# Patient Record
Sex: Female | Born: 2000 | Race: Black or African American | Hispanic: No | Marital: Married | Smoking: Never smoker
Health system: Southern US, Community
[De-identification: ages and names within clinical notes are randomized; demographics above are authoritative.]

## PROBLEM LIST (undated history)

## (undated) ENCOUNTER — Emergency Department (HOSPITAL_COMMUNITY): Admission: EM | Payer: Medicaid Other | Source: Home / Self Care

## (undated) DIAGNOSIS — F419 Anxiety disorder, unspecified: Secondary | ICD-10-CM

## (undated) DIAGNOSIS — F329 Major depressive disorder, single episode, unspecified: Secondary | ICD-10-CM

## (undated) DIAGNOSIS — E282 Polycystic ovarian syndrome: Secondary | ICD-10-CM

## (undated) DIAGNOSIS — G479 Sleep disorder, unspecified: Secondary | ICD-10-CM

## (undated) DIAGNOSIS — F445 Conversion disorder with seizures or convulsions: Secondary | ICD-10-CM

## (undated) DIAGNOSIS — F429 Obsessive-compulsive disorder, unspecified: Secondary | ICD-10-CM

## (undated) DIAGNOSIS — F32A Depression, unspecified: Secondary | ICD-10-CM

## (undated) DIAGNOSIS — F4310 Post-traumatic stress disorder, unspecified: Secondary | ICD-10-CM

## (undated) HISTORY — DX: Sleep disorder, unspecified: G47.9

## (undated) HISTORY — PX: DENTAL SURGERY: SHX609

## (undated) HISTORY — PX: TONSILLECTOMY: SUR1361

## (undated) HISTORY — DX: Obsessive-compulsive disorder, unspecified: F42.9

## (undated) HISTORY — DX: Morbid (severe) obesity due to excess calories: E66.01

## (undated) NOTE — *Deleted (*Deleted)
[facility] EMERGENCY DEPARTMENT Provider Note   CSN: [id number] Arrival date & time: 01/24/20  1629     History Chief Complaint  Patient presents with  . Neck Pain    Elizabeth Lam is a 88 y.o. female   No trigger  8am    HPI     Past Medical History:  Diagnosis Date  . Anxiety   . Depression   . Difficulty sleeping   . OCD (obsessive compulsive disorder)   . PCOS (polycystic ovarian syndrome)   . Psychogenic nonepileptic seizure   . PTSD (post-traumatic stress disorder)     Patient Active Problem List   Diagnosis Date Noted  . Insomnia   . Vasovagal episode 10/30/2018  . Seizure-like activity (HCC) 10/30/2018  . Anxiety state 10/30/2018  . MDD (major depressive disorder), recurrent severe, without psychosis (HCC) 06/02/2018  . Chronic post-traumatic stress disorder (PTSD) 06/02/2018  . Suicide ideation 06/02/2018  . Self-injurious behavior 06/02/2018    Past Surgical History:  Procedure Laterality Date  . DENTAL SURGERY       OB History   No obstetric history on file.     Family History  Problem Relation Age of Onset  . Healthy Mother   . Healthy Father     Social History   Tobacco Use  . Smoking status: Never Smoker  . Smokeless tobacco: Never Used  Vaping Use  . Vaping Use: Never used  Substance Use Topics  . Alcohol use: Never  . Drug use: Yes    Types: Marijuana    Comment: marijuana    Home Medications Prior to Admission medications   Medication Sig Start Date End Date Taking? Authorizing Provider  cetirizine (ZYRTEC) 10 MG tablet Take 1 tablet (10 mg total) by mouth daily. 12/08/19   Hall-Potvin, [place], PA-C  EPINEPHrine 0.3 mg/0.3 mL IJ SOAJ injection Inject 0.3 mg into the muscle as needed for anaphylaxis.  02/12/19   [provider]  EUCRISA 2 % OINT Apply topically 2 (two) times daily. 12/08/19   [provider]  lidocaine (XYLOCAINE) 2 % solution Use as directed 15 mLs in the mouth or throat  as needed for mouth pain. 12/08/19   Hall-Potvin, [place], PA-C  methocarbamol (ROBAXIN) 500 MG tablet Take 1 tablet (500 mg total) by mouth 3 (three) times daily. 12/25/19   Wieters, Hallie C, PA-C  naproxen (NAPROSYN) 500 MG tablet Take 1 tablet (500 mg total) by mouth 2 (two) times daily. 12/25/19   Wieters, Junius Creamer, PA-C  UNKNOWN TO PATIENT     [provider]  VRAYLAR capsule SMARTSIG:1 Capsule(s) By Mouth Every Evening 01/03/20   [provider]  zolpidem (AMBIEN) 5 MG tablet Take 5 mg by mouth at bedtime. 12/09/19   [provider]  famotidine (PEPCID) 20 MG tablet Take 1 tablet (20 mg total) by mouth 2 (two) times daily. 03/30/19 06/02/19  Niel Hummer, MD  ipratropium (ATROVENT) 0.06 % nasal spray Place 2 sprays into both nostrils 4 (four) times daily. 02/21/19 06/02/19  Belinda Fisher, PA-C  metoCLOPramide (REGLAN) 10 MG tablet Take 1 tablet (10 mg total) by mouth every 6 (six) hours. 02/21/19 06/02/19  Cathie Hoops, Amy V, PA-C  prazosin (MINIPRESS) 2 MG capsule Take 1 capsule (2 mg total) by mouth at bedtime. 11/11/18 06/02/19  Randall Hiss, MD  SRONYX 0.1-20 MG-MCG tablet TAKE ONE TABLET BY MOUTH DAILY FOR 28 DAYS. 12/28/18 06/02/19  [provider]    Allergies    Red  dye, Bee venom, and Other  Review of Systems   Review of Systems  Physical Exam Updated Vital Signs BP 119/69 (BP Location: Left Arm)   Pulse 97   Temp 98.6 F (37 C) (Oral)   Resp 18   Ht 5\' 6"  (1.676 m)   Wt 103.9 kg   LMP 12/08/2019   SpO2 97%   BMI 36.96 kg/m   Physical Exam  ED Results / Procedures / Treatments   Labs (all labs ordered are listed, but only abnormal results are displayed) Labs Reviewed - No data to display  EKG None  Radiology DG Cervical Spine Complete  Result Date: 01/24/2020 CLINICAL DATA:  Seizure, fall.  Neck pain EXAM: CERVICAL SPINE - COMPLETE 4+ VIEW COMPARISON:  None. FINDINGS: There is no evidence of cervical spine fracture or prevertebral soft  tissue swelling. Alignment is normal. No other significant bone abnormalities are identified. IMPRESSION: Negative cervical spine radiographs. Electronically Signed   By: Marlan Palau M.D.   On: 01/24/2020 17:21    Procedures Procedures (including critical care time)  Medications Ordered in ED Medications - No data to display  ED Course  I have reviewed the triage vital signs and the nursing notes.  Pertinent labs & imaging results that were available during my care of the patient were reviewed by me and considered in my medical decision making (see chart for details).    MDM Rules/Calculators/A&P                          *** Final Clinical Impression(s) / ED Diagnoses Final diagnoses:  None    Rx / DC Orders ED Discharge Orders    None

---
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
## 2021-02-05 ENCOUNTER — Ambulatory Visit: Admit: 2021-02-05 | Discharge status: Against Medical Advice | Payer: Medicaid Other

## 2021-02-20 ENCOUNTER — Emergency Department (HOSPITAL_COMMUNITY)
Admission: EM | Admit: 2021-02-20 | Discharge: 2021-02-20 | Disposition: A | Discharge status: Home / Self Care | Payer: Medicaid Other | Attending: Emergency Medicine | Admitting: Emergency Medicine

## 2021-02-20 ENCOUNTER — Encounter (HOSPITAL_COMMUNITY): Payer: Self-pay | Admitting: Emergency Medicine

## 2021-02-20 DIAGNOSIS — R109 Unspecified abdominal pain: Secondary | ICD-10-CM | POA: Insufficient documentation

## 2021-02-20 DIAGNOSIS — Z79899 Other long term (current) drug therapy: Secondary | ICD-10-CM | POA: Diagnosis not present

## 2021-02-20 DIAGNOSIS — Y92531 Health care provider office as the place of occurrence of the external cause: Secondary | ICD-10-CM | POA: Insufficient documentation

## 2021-02-20 DIAGNOSIS — W010XXA Fall on same level from slipping, tripping and stumbling without subsequent striking against object, initial encounter: Secondary | ICD-10-CM | POA: Insufficient documentation

## 2021-02-20 DIAGNOSIS — Z8616 Personal history of COVID-19: Secondary | ICD-10-CM | POA: Insufficient documentation

## 2021-02-20 DIAGNOSIS — W19XXXA Unspecified fall, initial encounter: Secondary | ICD-10-CM

## 2021-02-20 DIAGNOSIS — Z3202 Encounter for pregnancy test, result negative: Secondary | ICD-10-CM | POA: Insufficient documentation

## 2021-02-20 LAB — POC URINE PREG, ED: Preg Test, Ur: NEGATIVE

## 2021-02-20 NOTE — ED Triage Notes (Signed)
Patient here today reporting possible pregnancy.

## 2021-02-20 NOTE — ED Provider Notes (Signed)
[facility] DEPT Provider Note   CSN: [id number] Arrival date & time: 02/20/21  1620     History Chief Complaint  Patient presents with   Possible Pregnancy    Elizabeth Lam is a 20 y.o. female.  HPI Patient presents with concern of possible pregnancy as well as pain following a fall. Patient has a history of 1 prior pregnancy with miscarriage.  Last menstrual period was about 5 weeks ago.  She notes that she had 3 vaguely positive pregnancy tests at home.  Today with consideration of pregnancy she went to the health [facility].  While entering the building she slipped on water, fell, did not hit her head, had no loss of consciousness.  She has been ambulatory, awake, alert, no weakness since the fall.  She has mild discomfort in her left lateral abdomen which has improved substantially since that time.  EMS encouraged her for evaluation secondary to her pregnancy status. She states that she was generally well until the fall, with no other recent changes beyond aformentioned possible positive pregnancy test.    Past Medical History:  Diagnosis Date   Anxiety    COVID-19 virus infection 04/2020   Depression    Difficulty sleeping    Morbid obesity ([place])    OCD (obsessive compulsive disorder)    PCOS (polycystic ovarian syndrome)    Psychogenic nonepileptic seizure    PTSD (post-traumatic stress disorder)    Vaso vagal episode 2021    Patient Active Problem List   Diagnosis Date Noted   Morbid obesity ([place])    COVID-19 virus infection 04/2020   Psychogenic nonepileptic seizure 01/24/2020   Vaso vagal episode 2021   Insomnia    Vasovagal episode 10/30/2018   Seizure-like activity ([place]) 10/30/2018   Anxiety state 10/30/2018   MDD (major depressive disorder), recurrent severe, 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/02/2018   Chronic post-traumatic stress disorder (PTSD) 06/02/2018   Suicide ideation 06/02/2018   Self-injurious behavior 06/02/2018     Past Surgical History:  Procedure Laterality Date   DENTAL SURGERY     TONSILLECTOMY       OB History     Gravida  1   Para      Term      Preterm      AB      Living         SAB      IAB      Ectopic      Multiple      Live Births              Family History  Problem Relation Age of Onset   Arrhythmia Mother        [place], has ICD   Healthy Father     Social History   Tobacco Use   Smoking status: Never   Smokeless tobacco: Never  Vaping Use   Vaping Use: Some days   Substances: Nicotine  Substance Use Topics   Alcohol use: Never   Drug use: Yes    Types: Marijuana    Home Medications Prior to Admission medications   Medication Sig Start Date End Date Taking? Authorizing Provider  cetirizine (ZYRTEC) 10 MG tablet Take 1 tablet (10 mg total) by mouth daily as needed for allergies or rhinitis. 09/28/20   Kathie Dike, MD  EPINEPHrine 0.3 mg/0.3 mL IJ SOAJ injection Inject 0.3 mg into the muscle once as needed for anaphylaxis. 02/12/19   [provider]  metroNIDAZOLE (FLAGYL) 500  MG tablet Take 1 tablet (500 mg total) by mouth 2 (two) times daily. Avoid alcohol while taking this medication and for 2 days after 12/26/20   Leonia Reader  [facility] HFA 108 [phone] Base) MCG/ACT inhaler Inhale 2 puffs into the lungs every 6 (six) hours as needed for wheezing or shortness of breath.    [provider]  triamcinolone cream (KENALOG) 0.1 % Apply 1 application topically 3 (three) times daily.    [provider]  ipratropium (ATROVENT) 0.06 % nasal spray Place 2 sprays into both nostrils 4 (four) times daily. 02/21/19 06/02/19  Ok Edwards, PA-C  metoCLOPramide (REGLAN) 10 MG tablet Take 1 tablet (10 mg total) by mouth every 6 (six) hours. 02/21/19 06/02/19  Tasia Catchings, Amy V, PA-C  prazosin (MINIPRESS) 2 MG capsule Take 1 capsule (2 mg total) by mouth at bedtime. 11/11/18 06/02/19  Toney Rakes, MD  SRONYX 0.1-20 MG-MCG tablet TAKE ONE  TABLET BY MOUTH DAILY FOR 28 DAYS. 12/28/18 06/02/19  [provider]    Allergies    Bee venom, Fire ant, Other, Red dye, Wasp venom, and Eucrisa [crisaborole]  Review of Systems   Review of Systems  Constitutional:        Per HPI, otherwise negative  HENT:         Per HPI, otherwise negative  Respiratory:         Per HPI, otherwise negative  Cardiovascular:        Per HPI, otherwise negative  Gastrointestinal:  Negative for vomiting.  Endocrine:       Negative aside from HPI  Genitourinary:        Neg aside from HPI   Musculoskeletal:        Per HPI, otherwise negative  Skin: Negative.   Neurological:  Negative for syncope.   Physical Exam Updated Vital Signs BP 138/76 (BP Location: Right Arm)   Pulse 78   Temp 98.2 F (36.8 C) (Oral)   Resp 16   LMP  (LMP Unknown)   SpO2 100%   Physical Exam Vitals and nursing note reviewed.  Constitutional:      General: She is not in acute distress.    Appearance: She is well-developed.  HENT:     Head: Normocephalic and atraumatic.  Eyes:     Conjunctiva/sclera: Conjunctivae normal.  Cardiovascular:     Rate and Rhythm: Normal rate and regular rhythm.  Pulmonary:     Effort: Pulmonary effort is normal. No respiratory distress.     Breath sounds: Normal breath sounds. No stridor.  Abdominal:     General: There is no distension.     Tenderness: There is no abdominal tenderness. There is no guarding.  Skin:    General: Skin is warm and dry.  Neurological:     General: No focal deficit present.     Mental Status: She is alert and oriented to person, place, and time.     Cranial Nerves: No cranial nerve deficit.     Motor: No weakness.     Gait: Gait normal.  Psychiatric:        Mood and Affect: Mood normal.        Behavior: Behavior normal.    ED Results / Procedures / Treatments   Labs (all labs ordered are listed, but only abnormal results are displayed) Labs Reviewed  POC URINE PREG, ED     EKG None  Radiology No results found.  Procedures Procedures   Medications Ordered  in ED Medications - No data to display  ED Course  I have reviewed the triage vital signs and the nursing notes.  Pertinent labs & imaging results that were available during my care of the patient were reviewed by me and considered in my medical decision making (see chart for details).  Young female presents after a fall with possible pregnancy.  Here pregnancy test is negative, physical exam is reassuring, she is hemodynamically unremarkable has no evidence for neurovascular compromise.  We discussed options for follow-up and she requests additional resources for behavioral health which was accommodated as well.  With reassuring findings as above, patient discharged in stable condition. Final Clinical Impression(s) / ED Diagnoses Final diagnoses:  Fall, initial encounter     Carmin Muskrat, MD 02/20/21 1728

## 2021-02-20 NOTE — Discharge Instructions (Signed)
As discussed, a pregnancy test has been sent.  If this result is positive, please be sure to follow-up with either our women's health clinic or with your obstetrician. Otherwise, your evaluation today has been reassuring.  However if exam new, or concerning changes do not hesitate to return here.

## 2021-02-23 ENCOUNTER — Emergency Department (HOSPITAL_COMMUNITY)
Admission: EM | Admit: 2021-02-23 | Discharge: 2021-02-23 | Disposition: A | Discharge status: Home / Self Care | Payer: Medicaid Other | Attending: Emergency Medicine | Admitting: Emergency Medicine

## 2021-02-23 ENCOUNTER — Emergency Department (HOSPITAL_COMMUNITY): Payer: Medicaid Other

## 2021-02-23 ENCOUNTER — Encounter (HOSPITAL_COMMUNITY): Payer: Self-pay

## 2021-02-23 ENCOUNTER — Other Ambulatory Visit: Payer: Self-pay

## 2021-02-23 DIAGNOSIS — R109 Unspecified abdominal pain: Secondary | ICD-10-CM | POA: Diagnosis present

## 2021-02-23 DIAGNOSIS — R102 Pelvic and perineal pain: Secondary | ICD-10-CM | POA: Insufficient documentation

## 2021-02-23 DIAGNOSIS — Z8742 Personal history of other diseases of the female genital tract: Secondary | ICD-10-CM | POA: Insufficient documentation

## 2021-02-23 DIAGNOSIS — Z8616 Personal history of COVID-19: Secondary | ICD-10-CM | POA: Diagnosis not present

## 2021-02-23 LAB — BASIC METABOLIC PANEL
Anion gap: 4 — ABNORMAL LOW (ref 5–15)
BUN: 10 mg/dL (ref 6–20)
CO2: 28 mmol/L (ref 22–32)
Calcium: 9.1 mg/dL (ref 8.9–10.3)
Chloride: 107 mmol/L (ref 98–111)
Creatinine, Ser: 0.96 mg/dL (ref 0.44–1.00)
GFR, Estimated: >60 mL/min (ref >60)
Glucose, Bld: 92 mg/dL (ref 70–99)
Potassium: 4 mmol/L (ref 3.5–5.1)
Sodium: 139 mmol/L (ref 135–145)

## 2021-02-23 LAB — CBC WITH DIFFERENTIAL/PLATELET
Abs Immature Granulocytes: 0.01 10*3/uL (ref 0.00–0.07)
Basophils Absolute: 0 10*3/uL (ref 0.0–0.1)
Basophils Relative: 1 %
Eosinophils Absolute: 0.1 10*3/uL (ref 0.0–0.5)
Eosinophils Relative: 2 %
HCT: 37.8 % (ref 36.0–46.0)
Hemoglobin: 12.3 g/dL (ref 12.0–15.0)
Immature Granulocytes: 0 %
Lymphocytes Relative: 32 %
Lymphs Abs: 1.7 10*3/uL (ref 0.7–4.0)
MCH: 29.1 pg (ref 26.0–34.0)
MCHC: 32.5 g/dL (ref 30.0–36.0)
MCV: 89.4 fL (ref 80.0–100.0)
Monocytes Absolute: 0.7 10*3/uL (ref 0.1–1.0)
Monocytes Relative: 12 %
Neutro Abs: 2.9 10*3/uL (ref 1.7–7.7)
Neutrophils Relative %: 53 %
Platelets: 287 10*3/uL (ref 150–400)
RBC: 4.23 MIL/uL (ref 3.87–5.11)
RDW: 13 % (ref 11.5–15.5)
WBC: 5.4 10*3/uL (ref 4.0–10.5)
nRBC: 0 % (ref 0.0–0.2)

## 2021-02-23 LAB — URINALYSIS, ROUTINE W REFLEX MICROSCOPIC
Bilirubin Urine: NEGATIVE
Glucose, UA: NEGATIVE mg/dL
Hgb urine dipstick: NEGATIVE
Ketones, ur: NEGATIVE mg/dL
Leukocytes,Ua: NEGATIVE
Nitrite: NEGATIVE
Protein, ur: NEGATIVE mg/dL
Specific Gravity, Urine: 1.021 (ref 1.005–1.030)
pH: 6 (ref 5.0–8.0)

## 2021-02-23 LAB — PREGNANCY, URINE: Preg Test, Ur: NEGATIVE

## 2021-02-23 NOTE — ED Triage Notes (Addendum)
Pt reports with lower left abdominal pain x 1 week that she states is probably from her PCOS.

## 2021-02-23 NOTE — ED Provider Notes (Signed)
[facility] DEPT Provider Note   CSN: [id number] Arrival date & time: 02/23/21  0405     History Chief Complaint  Patient presents with   Abdominal Pain    Elizabeth Lam is a 20 y.o. female.  Patient presents to the emergency department with a chief complaint of left sided adnexal pain.  She states she has had the symptoms for about a week.  Reports that they are gradually worsening.  States that she was seen here recently and had negative pregnancy test.  She does report history of PCOS.  She denies any fever, chills, nausea, vomiting.  Denies any urinary symptoms.  Denies successful treatments prior to arrival.  The history is provided by the patient. No language interpreter was used.      Past Medical History:  Diagnosis Date   Anxiety    COVID-19 virus infection 04/2020   Depression    Difficulty sleeping    Morbid obesity ([facility])    OCD (obsessive compulsive disorder)    PCOS (polycystic ovarian syndrome)    Psychogenic nonepileptic seizure    PTSD (post-traumatic stress disorder)    Vaso vagal episode 2021    Patient Active Problem List   Diagnosis Date Noted   Morbid obesity ([place])    COVID-19 virus infection 04/2020   Psychogenic nonepileptic seizure 01/24/2020   Vaso vagal episode 2021   Insomnia    Vasovagal episode 10/30/2018   Seizure-like activity ([place]) 10/30/2018   Anxiety state 10/30/2018   MDD (major depressive disorder), recurrent severe, 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/02/2018   Chronic post-traumatic stress disorder (PTSD) 06/02/2018   Suicide ideation 06/02/2018   Self-injurious behavior 06/02/2018    Past Surgical History:  Procedure Laterality Date   DENTAL SURGERY     TONSILLECTOMY       OB History     Gravida  1   Para      Term      Preterm      AB      Living         SAB      IAB      Ectopic      Multiple      Live Births              Family History  Problem Relation Age of  Onset   Arrhythmia Mother        VT, has ICD   Healthy Father     Social History   Tobacco Use   Smoking status: Never   Smokeless tobacco: Never  Vaping Use   Vaping Use: Some days   Substances: Nicotine  Substance Use Topics   Alcohol use: Never   Drug use: Yes    Types: Marijuana    Home Medications Prior to Admission medications   Medication Sig Start Date End Date Taking? Authorizing Provider  cetirizine (ZYRTEC) 10 MG tablet Take 1 tablet (10 mg total) by mouth daily as needed for allergies or rhinitis. 09/28/20   Kathie Dike, MD  EPINEPHrine 0.3 mg/0.3 mL IJ SOAJ injection Inject 0.3 mg into the muscle once as needed for anaphylaxis. 02/12/19   [provider]  metroNIDAZOLE (FLAGYL) 500 MG tablet Take 1 tablet (500 mg total) by mouth 2 (two) times daily. Avoid alcohol while taking this medication and for 2 days after 12/26/20   Leonia Reader  [facility] HFA 108 [phone] Base) MCG/ACT inhaler Inhale 2 puffs into the lungs every 6 (six) hours as  needed for wheezing or shortness of breath.    [provider]  triamcinolone cream (KENALOG) 0.1 % Apply 1 application topically 3 (three) times daily.    [provider]  ipratropium (ATROVENT) 0.06 % nasal spray Place 2 sprays into both nostrils 4 (four) times daily. 02/21/19 06/02/19  Ok Edwards, PA-C  metoCLOPramide (REGLAN) 10 MG tablet Take 1 tablet (10 mg total) by mouth every 6 (six) hours. 02/21/19 06/02/19  Tasia Catchings, Amy V, PA-C  prazosin (MINIPRESS) 2 MG capsule Take 1 capsule (2 mg total) by mouth at bedtime. 11/11/18 06/02/19  Toney Rakes, MD  SRONYX 0.1-20 MG-MCG tablet TAKE ONE TABLET BY MOUTH DAILY FOR 28 DAYS. 12/28/18 06/02/19  [provider]    Allergies    Bee venom, Fire ant, Other, Red dye, Wasp venom, and Eucrisa [crisaborole]  Review of Systems   Review of Systems  All other systems reviewed and are negative.  Physical Exam Updated Vital Signs BP 121/90 (BP Location: Left  Arm)   Pulse 71   Temp 98.2 F (36.8 C) (Oral)   Resp 18   Ht 5\' 7"  (1.702 m)   Wt 98.9 kg   LMP  (LMP Unknown)   SpO2 100%   BMI 34.14 kg/m   Physical Exam Vitals and nursing note reviewed.  Constitutional:      General: She is not in acute distress.    Appearance: She is well-developed.  HENT:     Head: Normocephalic and atraumatic.  Eyes:     Conjunctiva/sclera: Conjunctivae normal.  Cardiovascular:     Rate and Rhythm: Normal rate and regular rhythm.     Heart sounds: No murmur heard. Pulmonary:     Effort: Pulmonary effort is normal. No respiratory distress.     Breath sounds: Normal breath sounds.  Abdominal:     Palpations: Abdomen is soft.     Tenderness: There is no abdominal tenderness.     Comments: Left adnexal tenderness  Musculoskeletal:        General: No swelling.     Cervical back: Neck supple.  Skin:    General: Skin is warm and dry.     Capillary Refill: Capillary refill takes less than 2 seconds.  Neurological:     Mental Status: She is alert.  Psychiatric:        Mood and Affect: Mood normal.    ED Results / Procedures / Treatments   Labs (all labs ordered are listed, but only abnormal results are displayed) Labs Reviewed  CBC WITH DIFFERENTIAL/PLATELET  BASIC METABOLIC PANEL  URINALYSIS, ROUTINE W REFLEX MICROSCOPIC  PREGNANCY, URINE    EKG None  Radiology US Transvaginal Non-OB  Result Date: 02/23/2021 CLINICAL DATA:  Left adnexal pain. EXAM: TRANSABDOMINAL AND TRANSVAGINAL ULTRASOUND OF PELVIS DOPPLER ULTRASOUND OF OVARIES TECHNIQUE: Both transabdominal and transvaginal ultrasound examinations of the pelvis were performed. Transabdominal technique was performed for global imaging of the pelvis including uterus, ovaries, adnexal regions, and pelvic cul-de-sac. It was necessary to proceed with endovaginal exam following the transabdominal exam to visualize the endometrium. Color and duplex Doppler ultrasound was utilized to evaluate  blood flow to the ovaries. COMPARISON:  None. FINDINGS: Uterus Measurements: 5.5 x 2.5 x 3.5 cm = volume: 25.4 mL. No fibroids or other mass visualized. Endometrium Thickness: 3 mm.  No focal abnormality visualized. Right ovary Measurements: 4.9 x 2.7 x 2.6 cm = volume: 18.2 mL. Normal appearance/no adnexal mass. Left ovary Measurements: 4.0 x 2.5 x 3.0 cm =  volume: 15.8 mL. Normal appearance/no adnexal mass. Pulsed Doppler evaluation of both ovaries demonstrates normal low-resistance arterial and venous waveforms. Other findings No abnormal free fluid. IMPRESSION: Unremarkable pelvic ultrasound. No ultrasound findings to explain the patient's history of pain. Electronically Signed   By: Misty Stanley M.D.   On: 02/23/2021 06:17   US Pelvis Complete  Result Date: 02/23/2021 CLINICAL DATA:  Left adnexal pain. EXAM: TRANSABDOMINAL AND TRANSVAGINAL ULTRASOUND OF PELVIS DOPPLER ULTRASOUND OF OVARIES TECHNIQUE: Both transabdominal and transvaginal ultrasound examinations of the pelvis were performed. Transabdominal technique was performed for global imaging of the pelvis including uterus, ovaries, adnexal regions, and pelvic cul-de-sac. It was necessary to proceed with endovaginal exam following the transabdominal exam to visualize the endometrium. Color and duplex Doppler ultrasound was utilized to evaluate blood flow to the ovaries. COMPARISON:  None. FINDINGS: Uterus Measurements: 5.5 x 2.5 x 3.5 cm = volume: 25.4 mL. No fibroids or other mass visualized. Endometrium Thickness: 3 mm.  No focal abnormality visualized. Right ovary Measurements: 4.9 x 2.7 x 2.6 cm = volume: 18.2 mL. Normal appearance/no adnexal mass. Left ovary Measurements: 4.0 x 2.5 x 3.0 cm = volume: 15.8 mL. Normal appearance/no adnexal mass. Pulsed Doppler evaluation of both ovaries demonstrates normal low-resistance arterial and venous waveforms. Other findings No abnormal free fluid. IMPRESSION: Unremarkable pelvic ultrasound. No ultrasound  findings to explain the patient's history of pain. Electronically Signed   By: Misty Stanley M.D.   On: 02/23/2021 06:17   [place] Art/Ven Flow Abd Pelv Doppler  Result Date: 02/23/2021 CLINICAL DATA:  Left adnexal pain. EXAM: TRANSABDOMINAL AND TRANSVAGINAL ULTRASOUND OF PELVIS DOPPLER ULTRASOUND OF OVARIES TECHNIQUE: Both transabdominal and transvaginal ultrasound examinations of the pelvis were performed. Transabdominal technique was performed for global imaging of the pelvis including uterus, ovaries, adnexal regions, and pelvic cul-de-sac. It was necessary to proceed with endovaginal exam following the transabdominal exam to visualize the endometrium. Color and duplex Doppler ultrasound was utilized to evaluate blood flow to the ovaries. COMPARISON:  None. FINDINGS: Uterus Measurements: 5.5 x 2.5 x 3.5 cm = volume: 25.4 mL. No fibroids or other mass visualized. Endometrium Thickness: 3 mm.  No focal abnormality visualized. Right ovary Measurements: 4.9 x 2.7 x 2.6 cm = volume: 18.2 mL. Normal appearance/no adnexal mass. Left ovary Measurements: 4.0 x 2.5 x 3.0 cm = volume: 15.8 mL. Normal appearance/no adnexal mass. Pulsed Doppler evaluation of both ovaries demonstrates normal low-resistance arterial and venous waveforms. Other findings No abnormal free fluid. IMPRESSION: Unremarkable pelvic ultrasound. No ultrasound findings to explain the patient's history of pain. Electronically Signed   By: Misty Stanley M.D.   On: 02/23/2021 06:17    Procedures Procedures   Medications Ordered in ED Medications - No data to display  ED Course  I have reviewed the triage vital signs and the nursing notes.  Pertinent labs & imaging results that were available during my care of the patient were reviewed by me and considered in my medical decision making (see chart for details).    MDM Rules/Calculators/A&P                           Patient here with left adnexal tenderness.  Has history of PCOS.  Will check  pregnancy, urine, and ultrasound.  Overall, patient is well-appearing.  Plan for discharge if tests are reassuring.  Labs are reassuring.  Pregnancy test negative.  Urinalysis negative.  Ultrasound shows no masses, no torsion, no emergent findings.  Patient is requesting to be discharged.  I agree that she can be discharged with follow-up with PCP. Final Clinical Impression(s) / ED Diagnoses Final diagnoses:  Adnexal pain    Rx / DC Orders ED Discharge Orders     None        Montine Circle, PA-C 02/23/21 1856    Orpah Greek, MD 02/23/21 [phone]

## 2021-02-23 NOTE — Discharge Instructions (Signed)
Take ibuprofen or Tylenol for pain.    Your tests were are good in the ER.  Please follow-up with your doctor.

## 2021-02-28 ENCOUNTER — Ambulatory Visit
Admission: EM | Admit: 2021-02-28 | Discharge: 2021-02-28 | Disposition: A | Discharge status: Other Acute Inpatient Hospital | Payer: Medicaid Other

## 2021-02-28 ENCOUNTER — Emergency Department (HOSPITAL_COMMUNITY): Payer: Medicaid Other

## 2021-02-28 ENCOUNTER — Encounter (HOSPITAL_COMMUNITY): Payer: Self-pay

## 2021-02-28 ENCOUNTER — Other Ambulatory Visit: Payer: Self-pay

## 2021-02-28 ENCOUNTER — Encounter: Payer: Self-pay | Admitting: Emergency Medicine

## 2021-02-28 ENCOUNTER — Emergency Department (HOSPITAL_COMMUNITY)
Admission: EM | Admit: 2021-02-28 | Discharge: 2021-02-28 | Disposition: A | Discharge status: Home / Self Care | Payer: Medicaid Other | Attending: Emergency Medicine | Admitting: Emergency Medicine

## 2021-02-28 DIAGNOSIS — K920 Hematemesis: Secondary | ICD-10-CM

## 2021-02-28 DIAGNOSIS — Z20822 Contact with and (suspected) exposure to covid-19: Secondary | ICD-10-CM | POA: Insufficient documentation

## 2021-02-28 DIAGNOSIS — R042 Hemoptysis: Secondary | ICD-10-CM

## 2021-02-28 DIAGNOSIS — J029 Acute pharyngitis, unspecified: Secondary | ICD-10-CM | POA: Diagnosis not present

## 2021-02-28 DIAGNOSIS — Z8616 Personal history of COVID-19: Secondary | ICD-10-CM | POA: Diagnosis not present

## 2021-02-28 DIAGNOSIS — J069 Acute upper respiratory infection, unspecified: Secondary | ICD-10-CM | POA: Diagnosis not present

## 2021-02-28 DIAGNOSIS — R1110 Vomiting, unspecified: Secondary | ICD-10-CM | POA: Insufficient documentation

## 2021-02-28 DIAGNOSIS — R051 Acute cough: Secondary | ICD-10-CM

## 2021-02-28 LAB — RESP PANEL BY RT-PCR (FLU A&B, COVID) ARPGX2
Influenza A by PCR: NEGATIVE
Influenza B by PCR: NEGATIVE
SARS Coronavirus 2 by RT PCR: NEGATIVE

## 2021-02-28 MED ORDER — ONDANSETRON 4 MG PO TBDP: ORAL_TABLET | ORAL | 0 refills | Status: DC

## 2021-02-28 MED ORDER — ONDANSETRON 4 MG PO TBDP
4.0000 mg | ORAL_TABLET | Freq: Once | ORAL | Status: AC
  Administered 2021-02-28: 4 mg via ORAL
  Filled 2021-02-28: qty 1

## 2021-02-28 MED ORDER — BENZONATATE 200 MG PO CAPS: 200.0000 mg | ORAL_CAPSULE | Freq: Three times a day (TID) | ORAL | 0 refills | Status: DC | PRN

## 2021-02-28 MED ORDER — ACETAMINOPHEN 325 MG PO TABS
650.0000 mg | ORAL_TABLET | Freq: Once | ORAL | Status: AC
  Administered 2021-02-28: 650 mg via ORAL
  Filled 2021-02-28: qty 2

## 2021-02-28 MED ORDER — BENZONATATE 100 MG PO CAPS
200.0000 mg | ORAL_CAPSULE | Freq: Once | ORAL | Status: AC
  Administered 2021-02-28: 200 mg via ORAL
  Filled 2021-02-28: qty 2

## 2021-02-28 NOTE — ED Notes (Signed)
An After Visit Summary was printed and given to the patient. Discharge instructions given and no further questions at this time.  

## 2021-02-28 NOTE — ED Provider Notes (Signed)
[name]    CSN: [id number] Arrival date & time: 02/28/21  0834      History   Chief Complaint Chief Complaint  Patient presents with   Fever   Sore Throat    HPI Elizabeth Lam is a 20 y.o. female.   Patient presents with 2-day history of fever, sore throat, cough, nausea, vomiting.  Denies any known sick contacts.  T-max at home was 99 per patient.  Patient reports that she has had small amounts of bright red blood in her vomit as well.  Denies chest pain, shortness of breath, diarrhea, constipation, abdominal pain.  Patient has not yet taken any medications to alleviate symptoms.  Patient has had tonsils removed.   Fever Sore Throat   Past Medical History:  Diagnosis Date   Anxiety    COVID-19 virus infection 04/2020   Depression    Difficulty sleeping    Morbid obesity (HCC)    OCD (obsessive compulsive disorder)    PCOS (polycystic ovarian syndrome)    Psychogenic nonepileptic seizure    PTSD (post-traumatic stress disorder)    Vaso vagal episode 2021    Patient Active Problem List   Diagnosis Date Noted   Morbid obesity ([place])    COVID-19 virus infection 04/2020   Psychogenic nonepileptic seizure 01/24/2020   Vaso vagal episode 2021   Insomnia    Vasovagal episode 10/30/2018   Seizure-like activity ([place]) 10/30/2018   Anxiety state 10/30/2018   MDD (major depressive disorder), recurrent severe, without psychosis ([place]) 06/02/2018   Chronic post-traumatic stress disorder (PTSD) 06/02/2018   Suicide ideation 06/02/2018   Self-injurious behavior 06/02/2018    Past Surgical History:  Procedure Laterality Date   DENTAL SURGERY     TONSILLECTOMY      OB History     Gravida  1   Para      Term      Preterm      AB      Living         SAB      IAB      Ectopic      Multiple      Live Births               Home Medications    Prior to Admission medications   Medication Sig Start Date End Date Taking?  Authorizing Provider  cetirizine (ZYRTEC) 10 MG tablet Take 1 tablet (10 mg total) by mouth daily as needed for allergies or rhinitis. 09/28/20   Kathie Dike, MD  EPINEPHrine 0.3 mg/0.3 mL IJ SOAJ injection Inject 0.3 mg into the muscle once as needed for anaphylaxis. 02/12/19   [provider]  metroNIDAZOLE (FLAGYL) 500 MG tablet Take 1 tablet (500 mg total) by mouth 2 (two) times daily. Avoid alcohol while taking this medication and for 2 days after 12/26/20   Leonia Reader  [facility] HFA 108 [phone] Base) MCG/ACT inhaler Inhale 2 puffs into the lungs every 6 (six) hours as needed for wheezing or shortness of breath.    [provider]  triamcinolone cream (KENALOG) 0.1 % Apply 1 application topically 3 (three) times daily.    [provider]  ipratropium (ATROVENT) 0.06 % nasal spray Place 2 sprays into both nostrils 4 (four) times daily. 02/21/19 06/02/19  Ok Edwards, PA-C  metoCLOPramide (REGLAN) 10 MG tablet Take 1 tablet (10 mg total) by mouth every 6 (six) hours. 02/21/19 06/02/19  Ok Edwards, PA-C  prazosin (  MINIPRESS) 2 MG capsule Take 1 capsule (2 mg total) by mouth at bedtime. 11/11/18 06/02/19  Toney Rakes, MD  SRONYX 0.1-20 MG-MCG tablet TAKE ONE TABLET BY MOUTH DAILY FOR 28 DAYS. 12/28/18 06/02/19  [provider]    Family History Family History  Problem Relation Age of Onset   Arrhythmia Mother        [place], has ICD   Healthy Father     Social History Social History   Tobacco Use   Smoking status: Never   Smokeless tobacco: Never  Vaping Use   Vaping Use: Some days   Substances: Nicotine  Substance Use Topics   Alcohol use: Never   Drug use: Yes    Types: Marijuana     Allergies   Bee venom, Fire ant, Other, Red dye, Wasp venom, and Eucrisa [crisaborole]   Review of Systems Review of Systems Per HPI  Physical Exam Triage Vital Signs ED Triage Vitals [02/28/21 1019]  Enc Vitals Group     BP (!) 156/76     Pulse Rate 86      Resp 16     Temp 98.3 F (36.8 C)     Temp Source Oral     SpO2 97 %     Weight      Height      Head Circumference      Peak Flow      Pain Score 8     Pain Loc      Pain Edu?      Excl. in [place]?    No data found.  Updated Vital Signs BP (!) 156/76 (BP Location: Left Arm)   Pulse 86   Temp 98.3 F (36.8 C) (Oral)   Resp 16   LMP  (LMP Unknown)   SpO2 97%   Breastfeeding Unknown   Visual Acuity Right Eye Distance:   Left Eye Distance:   Bilateral Distance:    Right Eye Near:   Left Eye Near:    Bilateral Near:     Physical Exam Constitutional:      General: She is not in acute distress.    Appearance: Normal appearance. She is ill-appearing. She is not toxic-appearing or diaphoretic.  HENT:     Head: Normocephalic and atraumatic.     Right Ear: Tympanic membrane and ear canal normal.     Left Ear: Tympanic membrane and ear canal normal.     Nose: Congestion present.     Mouth/Throat:     Mouth: Mucous membranes are moist.     Pharynx: Posterior oropharyngeal erythema present.  Eyes:     Extraocular Movements: Extraocular movements intact.     Conjunctiva/sclera: Conjunctivae normal.     Pupils: Pupils are equal, round, and reactive to light.  Cardiovascular:     Rate and Rhythm: Normal rate and regular rhythm.     Pulses: Normal pulses.     Heart sounds: Normal heart sounds.  Pulmonary:     Effort: Pulmonary effort is normal. No respiratory distress.     Breath sounds: Normal breath sounds. No wheezing.  Abdominal:     General: Abdomen is flat. Bowel sounds are normal. There is no distension.     Palpations: Abdomen is soft.     Tenderness: There is no abdominal tenderness.  Musculoskeletal:        General: Normal range of motion.     Cervical back: Normal range of motion.  Skin:    General: Skin is warm  and dry.  Neurological:     General: No focal deficit present.     Mental Status: She is alert and oriented to person, place, and time. Mental  status is at baseline.  Psychiatric:        Mood and Affect: Mood normal.        Behavior: Behavior normal.     UC Treatments / Results  Labs (all labs ordered are listed, but only abnormal results are displayed) Labs Reviewed - No data to display  EKG   Radiology No results found.  Procedures Procedures (including critical care time)  Medications Ordered in UC Medications - No data to display  Initial Impression / Assessment and Plan / UC Course  I have reviewed the triage vital signs and the nursing notes.  Pertinent labs & imaging results that were available during my care of the patient were reviewed by me and considered in my medical decision making (see chart for details).     Suspect viral cause to patient's symptoms, although patient has had hematemesis.  This will need further evaluation and management at the hospital.  Advised patient that she will need to go to the hospital for further evaluation and management.  Patient was agreeable with plan.  Vital signs stable at discharge.  Agree with patient self transport to the hospital. Final Clinical Impressions(s) / UC Diagnoses   Final diagnoses:  Hematemesis with nausea  Sore throat  Acute cough     Discharge Instructions      Please go to the hospital as soon as you leave urgent care for further evaluation and management.    ED Prescriptions   None    PDMP not reviewed this encounter.   Teodora Medici, [place] 02/28/21 1043

## 2021-02-28 NOTE — ED Triage Notes (Signed)
Pt arrived via POV, c/o fever, coughing up blood x2 days.

## 2021-02-28 NOTE — ED Provider Notes (Signed)
Emergency Medicine Provider Triage Evaluation Note  Elizabeth Lam , a 20 y.o. female  was evaluated in triage.  Pt complains of fevers, chills, sore throat, cough.  She reports that she lost her voice 2 days ago.  Reports last night today when she had cough stuff up she had a little bit of blood mixed in with mucus which concerned her.  Denies chest pain or shortness of breath.  Also reports she has had some nausea and one episode of vomiting.  Review of Systems  Positive: Cough, fever, chills Negative: Abdominal pain, chest pain, shortness of breath  Physical Exam  BP (!) 141/69 (BP Location: Left Arm)   Pulse 88   Temp 99 F (37.2 C) (Oral)   Resp 17   Ht 5\' 7"  (1.702 m)   Wt 98.4 kg   LMP  (LMP Unknown)   SpO2 99%   BMI 33.99 kg/m  Gen:   Awake, no distress   Resp:  Normal effort  MSK:   Moves extremities without difficulty  Other:  Posterior oropharynx with mild erythema, no exudates, blood noted  Medical Decision Making  Medically screening exam initiated at 1:10 PM.  Appropriate orders placed.  Clea Mazzaferro was informed that the remainder of the evaluation will be completed by another provider, this initial triage assessment does not replace that evaluation, and the importance of remaining in the ED until their evaluation is complete.     Jacqlyn Larsen, PA-C 02/28/21 [place], MD 03/01/21 [phone]

## 2021-02-28 NOTE — ED Provider Notes (Signed)
[facility] DEPT Provider Note   CSN: [id number] Arrival date & time: 02/28/21  1231     History Chief Complaint  Patient presents with   Sore Throat   Hemoptysis    Elizabeth Lam is a 20 y.o. female.  Elizabeth Lam is a 20 y.o. female with a history of OCD, PCOS, nonepileptic seizures, who presents to the emergency department from urgent care for evaluation of fever, cough and hemoptysis.  Patient reports for the past 3 days she has been having fevers at home as well as chills and a persistent cough.  She reports she has been coughing up mucus and is seeing a little bit of blood mixed in her mucus in particular yesterday and today which really concerned her.  She did have an episode of vomiting but denies blood in her vomit.  She denies abdominal pain, chest pain or shortness of breath.  Unsure of sick contacts.  No other aggravating or alleviating factors.  The history is provided by the patient and medical records.  Sore Throat Pertinent negatives include no chest pain, no abdominal pain and no shortness of breath.      Past Medical History:  Diagnosis Date   Anxiety    COVID-19 virus infection 04/2020   Depression    Difficulty sleeping    Morbid obesity (HCC)    OCD (obsessive compulsive disorder)    PCOS (polycystic ovarian syndrome)    Psychogenic nonepileptic seizure    PTSD (post-traumatic stress disorder)    Vaso vagal episode 2021    Patient Active Problem List   Diagnosis Date Noted   Morbid obesity ([place])    COVID-19 virus infection 04/2020   Psychogenic nonepileptic seizure 01/24/2020   Vaso vagal episode 2021   Insomnia    Vasovagal episode 10/30/2018   Seizure-like activity ([place]) 10/30/2018   Anxiety state 10/30/2018   MDD (major depressive disorder), recurrent severe, 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/02/2018   Chronic post-traumatic stress disorder (PTSD) 06/02/2018   Suicide ideation 06/02/2018   Self-injurious behavior  06/02/2018    Past Surgical History:  Procedure Laterality Date   DENTAL SURGERY     TONSILLECTOMY       OB History     Gravida  1   Para      Term      Preterm      AB      Living         SAB      IAB      Ectopic      Multiple      Live Births              Family History  Problem Relation Age of Onset   Arrhythmia Mother        VT, has ICD   Healthy Father     Social History   Tobacco Use   Smoking status: Never   Smokeless tobacco: Never  Vaping Use   Vaping Use: Some days   Substances: Nicotine  Substance Use Topics   Alcohol use: Never   Drug use: Yes    Types: Marijuana    Home Medications Prior to Admission medications   Medication Sig Start Date End Date Taking? Authorizing Provider  benzonatate (TESSALON) 200 MG capsule Take 1 capsule (200 mg total) by mouth 3 (three) times daily as needed for cough. 02/28/21  Yes Jacqlyn Larsen, PA-C  ondansetron (ZOFRAN-ODT) 4 MG disintegrating tablet 4mg  ODT q4 hours prn nausea/vomit  02/28/21  Yes Jacqlyn Larsen, PA-C  cetirizine (ZYRTEC) 10 MG tablet Take 1 tablet (10 mg total) by mouth daily as needed for allergies or rhinitis. 09/28/20   Kathie Dike, MD  EPINEPHrine 0.3 mg/0.3 mL IJ SOAJ injection Inject 0.3 mg into the muscle once as needed for anaphylaxis. 02/12/19   [provider]  metroNIDAZOLE (FLAGYL) 500 MG tablet Take 1 tablet (500 mg total) by mouth 2 (two) times daily. Avoid alcohol while taking this medication and for 2 days after 12/26/20   Leonia Reader  [facility] HFA 108 [phone] Base) MCG/ACT inhaler Inhale 2 puffs into the lungs every 6 (six) hours as needed for wheezing or shortness of breath.    [provider]  triamcinolone cream (KENALOG) 0.1 % Apply 1 application topically 3 (three) times daily.    [provider]  ipratropium (ATROVENT) 0.06 % nasal spray Place 2 sprays into both nostrils 4 (four) times daily. 02/21/19 06/02/19  Ok Edwards, PA-C   metoCLOPramide (REGLAN) 10 MG tablet Take 1 tablet (10 mg total) by mouth every 6 (six) hours. 02/21/19 06/02/19  Tasia Catchings, Amy V, PA-C  prazosin (MINIPRESS) 2 MG capsule Take 1 capsule (2 mg total) by mouth at bedtime. 11/11/18 06/02/19  Toney Rakes, MD  SRONYX 0.1-20 MG-MCG tablet TAKE ONE TABLET BY MOUTH DAILY FOR 28 DAYS. 12/28/18 06/02/19  [provider]    Allergies    Bee venom, Fire ant, Other, Red dye, Wasp venom, and Eucrisa [crisaborole]  Review of Systems   Review of Systems  Constitutional:  Positive for chills and fever.  HENT:  Positive for congestion, rhinorrhea and sore throat.   Respiratory:  Positive for cough. Negative for shortness of breath.   Cardiovascular:  Negative for chest pain.  Gastrointestinal:  Positive for nausea and vomiting. Negative for abdominal pain and diarrhea.  Genitourinary:  Negative for dysuria.  Musculoskeletal:  Negative for myalgias.  Skin:  Negative for color change and rash.  Neurological:  Negative for dizziness, syncope and light-headedness.  All other systems reviewed and are negative.  Physical Exam Updated Vital Signs BP (!) 141/69 (BP Location: Left Arm)   Pulse 88   Temp 99 F (37.2 C) (Oral)   Resp 17   Ht 5\' 7"  (1.702 m)   Wt 98.4 kg   LMP 01/13/2021 (Exact Date)   SpO2 99%   BMI 33.99 kg/m   Physical Exam Vitals and nursing note reviewed.  Constitutional:      General: She is not in acute distress.    Appearance: Normal appearance. She is well-developed. She is not diaphoretic.  HENT:     Head: Normocephalic and atraumatic.     Nose: Congestion and rhinorrhea present.     Mouth/Throat:     Mouth: Mucous membranes are moist.     Pharynx: Oropharynx is clear. Uvula midline. No pharyngeal swelling, oropharyngeal exudate or posterior oropharyngeal erythema.  Eyes:     General:        Right eye: No discharge.        Left eye: No discharge.     Pupils: Pupils are equal, round, and reactive to light.   Cardiovascular:     Rate and Rhythm: Normal rate and regular rhythm.     Pulses: Normal pulses.     Heart sounds: Normal heart sounds. No murmur heard.   No friction rub. No gallop.  Pulmonary:     Effort: Pulmonary effort is normal. No respiratory distress.  Breath sounds: Normal breath sounds. No wheezing or rales.     Comments: Respirations equal and unlabored, patient able to speak in full sentences, lungs clear to auscultation bilaterally  Abdominal:     General: Bowel sounds are normal. There is no distension.     Palpations: Abdomen is soft. There is no mass.     Tenderness: There is no abdominal tenderness. There is no guarding.     Comments: Abdomen soft, nondistended, nontender to palpation in all quadrants without guarding or peritoneal signs  Musculoskeletal:        General: No deformity.     Cervical back: Neck supple.  Skin:    General: Skin is warm and dry.     Capillary Refill: Capillary refill takes less than 2 seconds.  Neurological:     Mental Status: She is alert and oriented to person, place, and time.     Coordination: Coordination normal.     Comments: Speech is clear, able to follow commands Moves extremities without ataxia, coordination intact  Psychiatric:        Mood and Affect: Mood normal.        Behavior: Behavior normal.    ED Results / Procedures / Treatments   Labs (all labs ordered are listed, but only abnormal results are displayed) Labs Reviewed  RESP PANEL BY RT-PCR (FLU A&B, COVID) ARPGX2    EKG None  Radiology DG Chest 2 View  Result Date: 02/28/2021 CLINICAL DATA:  Cough EXAM: CHEST - 2 VIEW COMPARISON:  09/27/2020 FINDINGS: The heart size and mediastinal contours are within normal limits. Both lungs are clear. The visualized skeletal structures are unremarkable. IMPRESSION: No active cardiopulmonary disease. Electronically Signed   By: Davina Poke D.O.   On: 02/28/2021 13:49    Procedures Procedures   Medications  Ordered in ED Medications  ondansetron (ZOFRAN-ODT) disintegrating tablet 4 mg (4 mg Oral Given 02/28/21 1505)  acetaminophen (TYLENOL) tablet 650 mg (650 mg Oral Given 02/28/21 1506)  benzonatate (TESSALON) capsule 200 mg (200 mg Oral Given 02/28/21 1506)    ED Course  I have reviewed the triage vital signs and the nursing notes.  Pertinent labs & imaging results that were available during my care of the patient were reviewed by me and considered in my medical decision making (see chart for details).    MDM Rules/Calculators/A&P                           Pt presents with nasal congestion and cough.  Patient did reports movements of blood mixed in with mucus after coughing, suspect this is due to her viral syndrome.  Pt is well appearing and vitals are normal. Lungs CTA on exam. Pt CXR negative for acute infiltrate. Patients symptoms are consistent with URI, likely viral etiology.  COVID and flu testing negative.  Without tachycardia, chest pain, shortness of breath, tachypnea or hypoxia have extremely low suspicion for PE.  Discussed that antibiotics are not indicated for viral infections. Pt will be discharged with symptomatic treatment.  Verbalizes understanding and is agreeable with plan. Pt is hemodynamically stable & in NAD prior to dc. Return precautions discussed, pt expresses understanding and agrees with plan.  Final Clinical Impression(s) / ED Diagnoses Final diagnoses:  Viral URI with cough  Blood in sputum    Rx / DC Orders ED Discharge Orders          Ordered    ondansetron (ZOFRAN-ODT) 4 MG disintegrating  tablet        02/28/21 1452    benzonatate (TESSALON) 200 MG capsule  3 times daily PRN        02/28/21 1452             Janet Berlin 02/28/21 2153    Varney Biles, MD 03/01/21 [phone]

## 2021-02-28 NOTE — Discharge Instructions (Addendum)
Your symptoms are likely caused by a viral upper respiratory infection. Antibiotics are not helpful in treating viral infection, the virus should run its course in about 5-7 days. Please make sure you are drinking plenty of fluids. You can treat your symptoms supportively with tylenol 1000 mg and ibuprofen 60 mg every 6 hours for fevers and pains, Zyrtec and Flonase to help with nasal congestion, and prescribed Tessalon Perles and throat lozenges to help with cough. If your symptoms are not improving please follow up with you Primary doctor.   If you develop persistent fevers, shortness of breath or difficulty breathing, start coughing or you see blood that is not mixed in with mucus after cough chest pain, severe headache and neck pain, persistent nausea and vomiting or other new or concerning symptoms return to the Emergency department.

## 2021-02-28 NOTE — ED Triage Notes (Addendum)
Reports fever, cough, sore throat, vomiting with some blood in it, lost voice, all happening over two days. Was in the hospital last week

## 2021-02-28 NOTE — Discharge Instructions (Signed)
Please go to the hospital as soon as you leave urgent care for further evaluation and management. 

## 2021-03-22 ENCOUNTER — Emergency Department (HOSPITAL_COMMUNITY)
Admission: EM | Admit: 2021-03-22 | Discharge: 2021-03-22 | Disposition: A | Discharge status: Home / Self Care | Payer: Medicaid Other | Attending: Emergency Medicine | Admitting: Emergency Medicine

## 2021-03-22 ENCOUNTER — Encounter (HOSPITAL_COMMUNITY): Payer: Self-pay

## 2021-03-22 DIAGNOSIS — N760 Acute vaginitis: Secondary | ICD-10-CM | POA: Diagnosis not present

## 2021-03-22 DIAGNOSIS — N938 Other specified abnormal uterine and vaginal bleeding: Secondary | ICD-10-CM | POA: Insufficient documentation

## 2021-03-22 DIAGNOSIS — R1030 Lower abdominal pain, unspecified: Secondary | ICD-10-CM | POA: Insufficient documentation

## 2021-03-22 DIAGNOSIS — Z8616 Personal history of COVID-19: Secondary | ICD-10-CM | POA: Insufficient documentation

## 2021-03-22 LAB — URINALYSIS, ROUTINE W REFLEX MICROSCOPIC
Bilirubin Urine: NEGATIVE
Glucose, UA: NEGATIVE mg/dL
Leukocytes,Ua: NEGATIVE
Nitrite: NEGATIVE
Protein, ur: 30 mg/dL — AB
Specific Gravity, Urine: 1.025 (ref 1.005–1.030)
pH: 6.5 (ref 5.0–8.0)

## 2021-03-22 LAB — COMPREHENSIVE METABOLIC PANEL
ALT: 16 U/L (ref 0–44)
AST: 26 U/L (ref 15–41)
Albumin: 4.2 g/dL (ref 3.5–5.0)
Alkaline Phosphatase: 51 U/L (ref 38–126)
Anion gap: 8 (ref 5–15)
BUN: 7 mg/dL (ref 6–20)
CO2: 25 mmol/L (ref 22–32)
Calcium: 9.5 mg/dL (ref 8.9–10.3)
Chloride: 107 mmol/L (ref 98–111)
Creatinine, Ser: 1.17 mg/dL — ABNORMAL HIGH (ref 0.44–1.00)
GFR, Estimated: >60 mL/min (ref >60)
Glucose, Bld: 104 mg/dL — ABNORMAL HIGH (ref 70–99)
Potassium: 3.5 mmol/L (ref 3.5–5.1)
Sodium: 140 mmol/L (ref 135–145)
Total Bilirubin: 0.8 mg/dL (ref 0.3–1.2)
Total Protein: 7.3 g/dL (ref 6.5–8.1)

## 2021-03-22 LAB — HCG, QUANTITATIVE, PREGNANCY: hCG, Beta Chain, Quant, S: <1 m[IU]/mL (ref <5)

## 2021-03-22 LAB — CBC WITH DIFFERENTIAL/PLATELET
Abs Immature Granulocytes: 0.02 10*3/uL (ref 0.00–0.07)
Basophils Absolute: 0 10*3/uL (ref 0.0–0.1)
Basophils Relative: 0 %
Eosinophils Absolute: 0.1 10*3/uL (ref 0.0–0.5)
Eosinophils Relative: 1 %
HCT: 40.7 % (ref 36.0–46.0)
Hemoglobin: 12.9 g/dL (ref 12.0–15.0)
Immature Granulocytes: 0 %
Lymphocytes Relative: 17 %
Lymphs Abs: 1.3 10*3/uL (ref 0.7–4.0)
MCH: 28.2 pg (ref 26.0–34.0)
MCHC: 31.7 g/dL (ref 30.0–36.0)
MCV: 89.1 fL (ref 80.0–100.0)
Monocytes Absolute: 0.7 10*3/uL (ref 0.1–1.0)
Monocytes Relative: 10 %
Neutro Abs: 5.4 10*3/uL (ref 1.7–7.7)
Neutrophils Relative %: 72 %
Platelets: 317 10*3/uL (ref 150–400)
RBC: 4.57 MIL/uL (ref 3.87–5.11)
RDW: 13.2 % (ref 11.5–15.5)
WBC: 7.5 10*3/uL (ref 4.0–10.5)
nRBC: 0 % (ref 0.0–0.2)

## 2021-03-22 LAB — WET PREP, GENITAL
Sperm: NONE SEEN
Trich, Wet Prep: NONE SEEN
WBC, Wet Prep HPF POC: <10 (ref <10)
Yeast Wet Prep HPF POC: NONE SEEN

## 2021-03-22 LAB — POC URINE PREG, ED: Preg Test, Ur: NEGATIVE

## 2021-03-22 LAB — LIPASE, BLOOD: Lipase: 26 U/L (ref 11–51)

## 2021-03-22 MED ORDER — IBUPROFEN 600 MG PO TABS: 600.0000 mg | ORAL_TABLET | Freq: Four times a day (QID) | ORAL | 0 refills | Status: DC | PRN

## 2021-03-22 MED ORDER — KETOROLAC TROMETHAMINE 30 MG/ML IJ SOLN
30.0000 mg | Freq: Once | INTRAMUSCULAR | Status: AC
  Administered 2021-03-22: 30 mg via INTRAVENOUS
  Filled 2021-03-22: qty 1

## 2021-03-22 MED ORDER — METRONIDAZOLE 500 MG PO TABS
500.0000 mg | ORAL_TABLET | Freq: Once | ORAL | Status: AC
  Administered 2021-03-22: 500 mg via ORAL
  Filled 2021-03-22: qty 1

## 2021-03-22 MED ORDER — METRONIDAZOLE 500 MG PO TABS: 500.0000 mg | ORAL_TABLET | Freq: Two times a day (BID) | ORAL | 0 refills | Status: DC

## 2021-03-22 MED ORDER — SODIUM CHLORIDE 0.9 % IV BOLUS
1000.0000 mL | Freq: Once | INTRAVENOUS | Status: AC
  Administered 2021-03-22: 1000 mL via INTRAVENOUS

## 2021-03-22 NOTE — ED Provider Notes (Signed)
[facility] DEPT Provider Note   CSN: [id number] Arrival date & time: 03/22/21  2100     History Chief Complaint  Patient presents with   Vaginal Bleeding    Elizabeth Lam is a 20 y.o. female.  Pt presents to the ED today with lower abdominal pain.  Pt said she has had lower abd pain for about 3 days.  She has a hx of PCOS.  She said she has not had her period since October, but also said she started having some bleeding 3 days ago.  She was seen here on 11/18 for the same.  Preg then was neg.  [place] then was neg.  Pt denies f/c.  No vaginal d/c.  No dysuria.      Past Medical History:  Diagnosis Date   Anxiety    COVID-19 virus infection 04/2020   Depression    Difficulty sleeping    Morbid obesity (HCC)    OCD (obsessive compulsive disorder)    PCOS (polycystic ovarian syndrome)    Psychogenic nonepileptic seizure    PTSD (post-traumatic stress disorder)    Vaso vagal episode 2021    Patient Active Problem List   Diagnosis Date Noted   Morbid obesity ([place])    COVID-19 virus infection 04/2020   Psychogenic nonepileptic seizure 01/24/2020   Vaso vagal episode 2021   Insomnia    Vasovagal episode 10/30/2018   Seizure-like activity ([place]) 10/30/2018   Anxiety state 10/30/2018   MDD (major depressive disorder), recurrent severe, 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   Chronic post-traumatic stress disorder (PTSD) 06/02/2018   Suicide ideation 06/02/2018   Self-injurious behavior 06/02/2018    Past Surgical History:  Procedure Laterality Date   DENTAL SURGERY     TONSILLECTOMY       OB History     Gravida  1   Para      Term      Preterm      AB      Living         SAB      IAB      Ectopic      Multiple      Live Births              Family History  Problem Relation Age of Onset   Arrhythmia Mother        [place], has ICD   Healthy Father     Social History   Tobacco Use   Smoking status: Never    Smokeless tobacco: Never  Vaping Use   Vaping Use: Some days   Substances: Nicotine  Substance Use Topics   Alcohol use: Never   Drug use: Yes    Types: Marijuana    Home Medications Prior to Admission medications   Medication Sig Start Date End Date Taking? Authorizing Provider  ibuprofen (ADVIL) 600 MG tablet Take 1 tablet (600 mg total) by mouth every 6 (six) hours as needed. 03/22/21  Yes Isla Pence, MD  metroNIDAZOLE (FLAGYL) 500 MG tablet Take 1 tablet (500 mg total) by mouth 2 (two) times daily. 03/22/21  Yes Isla Pence, MD  benzonatate (TESSALON) 200 MG capsule Take 1 capsule (200 mg total) by mouth 3 (three) times daily as needed for cough. 02/28/21   Jacqlyn Larsen, PA-C  cetirizine (ZYRTEC) 10 MG tablet Take 1 tablet (10 mg total) by mouth daily as needed for allergies or rhinitis. 09/28/20   Kathie Dike, MD  EPINEPHrine 0.3 mg/0.3 mL  IJ SOAJ injection Inject 0.3 mg into the muscle once as needed for anaphylaxis. 02/12/19   [provider]  ondansetron (ZOFRAN-ODT) 4 MG disintegrating tablet 4mg  ODT q4 hours prn nausea/vomit 02/28/21   Jacqlyn Larsen, PA-C  PROAIR HFA 108 [phone] Base) MCG/ACT inhaler Inhale 2 puffs into the lungs every 6 (six) hours as needed for wheezing or shortness of breath.    [provider]  triamcinolone cream (KENALOG) 0.1 % Apply 1 application topically 3 (three) times daily.    [provider]  ipratropium (ATROVENT) 0.06 % nasal spray Place 2 sprays into both nostrils 4 (four) times daily. 02/21/19 06/02/19  Ok Edwards, PA-C  metoCLOPramide (REGLAN) 10 MG tablet Take 1 tablet (10 mg total) by mouth every 6 (six) hours. 02/21/19 06/02/19  Tasia Catchings, Amy V, PA-C  prazosin (MINIPRESS) 2 MG capsule Take 1 capsule (2 mg total) by mouth at bedtime. 11/11/18 06/02/19  Toney Rakes, MD  SRONYX 0.1-20 MG-MCG tablet TAKE ONE TABLET BY MOUTH DAILY FOR 28 DAYS. 12/28/18 06/02/19  [provider]    Allergies    Bee venom,  Fire ant, Other, Red dye, Wasp venom, and Eucrisa [crisaborole]  Review of Systems   Review of Systems  Gastrointestinal:  Positive for abdominal pain.  All other systems reviewed and are negative.  Physical Exam Updated Vital Signs BP (!) 147/134    Pulse (!) 103    Temp 98.6 F (37 C) (Oral)    Resp 16    Ht 5\' 6"  (1.676 m)    Wt 98.9 kg    SpO2 100%    BMI 35.19 kg/m   Physical Exam Vitals and nursing note reviewed. Exam conducted with a chaperone present.  Constitutional:      Appearance: Normal appearance.  HENT:     Head: Normocephalic and atraumatic.     Right Ear: External ear normal.     Left Ear: External ear normal.     Nose: Nose normal.     Mouth/Throat:     Mouth: Mucous membranes are moist.     Pharynx: Oropharynx is clear.  Eyes:     Extraocular Movements: Extraocular movements intact.     Conjunctiva/sclera: Conjunctivae normal.     Pupils: Pupils are equal, round, and reactive to light.  Cardiovascular:     Rate and Rhythm: Normal rate and regular rhythm.     Pulses: Normal pulses.     Heart sounds: Normal heart sounds.  Pulmonary:     Effort: Pulmonary effort is normal.     Breath sounds: Normal breath sounds.  Abdominal:     General: Abdomen is flat. Bowel sounds are normal.     Palpations: Abdomen is soft.     Tenderness: There is abdominal tenderness in the suprapubic area.  Genitourinary:    Exam position: Lithotomy position.     Vagina: Normal.     Cervix: Normal.     Uterus: Tender.      Adnexa:        Right: Tenderness present.        Left: Tenderness present.   Musculoskeletal:        General: Normal range of motion.     Cervical back: Normal range of motion and neck supple.  Skin:    General: Skin is warm.     Capillary Refill: Capillary refill takes less than 2 seconds.  Neurological:     General: No focal deficit present.     Mental Status:  She is alert and oriented to person, place, and time.  Psychiatric:        Mood and  Affect: Mood normal.        Behavior: Behavior normal.    ED Results / Procedures / Treatments   Labs (all labs ordered are listed, but only abnormal results are displayed) Labs Reviewed  WET PREP, GENITAL - Abnormal; Notable for the following components:      Result Value   Clue Cells Wet Prep HPF POC PRESENT (*)    All other components within normal limits  COMPREHENSIVE METABOLIC PANEL - Abnormal; Notable for the following components:   Glucose, Bld 104 (*)    Creatinine, Ser 1.17 (*)    All other components within normal limits  URINALYSIS, ROUTINE W REFLEX MICROSCOPIC - Abnormal; Notable for the following components:   Hgb urine dipstick LARGE (*)    Ketones, ur TRACE (*)    Protein, ur 30 (*)    Bacteria, UA RARE (*)    All other components within normal limits  CBC WITH DIFFERENTIAL/PLATELET  LIPASE, BLOOD  HCG, QUANTITATIVE, PREGNANCY  POC URINE PREG, ED  GC/CHLAMYDIA PROBE AMP ([place]) NOT AT [facility]    EKG None  Radiology No results found.  Procedures Procedures   Medications Ordered in ED Medications  metroNIDAZOLE (FLAGYL) tablet 500 mg (has no administration in time range)  sodium chloride 0.9 % bolus 1,000 mL (1,000 mLs Intravenous New Bag/Given 03/22/21 2157)  ketorolac (TORADOL) 30 MG/ML injection 30 mg (30 mg Intravenous Given 03/22/21 2211)    ED Course  I have reviewed the triage vital signs and the nursing notes.  Pertinent labs & imaging results that were available during my care of the patient were reviewed by me and considered in my medical decision making (see chart for details).    MDM Rules/Calculators/A&P                         Preg negative.  Pt does have an obgyn.  She saw them on 11/7.  Blood work neg.  UA shows some blood which is likely form her vagina.  She is + for BV.  Pt is d/c with flagyl and ibuprofen.  She is to return if worse.  F/u with obgyn.    Final Clinical Impression(s) / ED Diagnoses Final diagnoses:  DUB  (dysfunctional uterine bleeding)  BV (bacterial vaginosis)    Rx / DC Orders ED Discharge Orders          Ordered    metroNIDAZOLE (FLAGYL) 500 MG tablet  2 times daily        03/22/21 2244    ibuprofen (ADVIL) 600 MG tablet  Every 6 hours PRN        03/22/21 2244             Isla Pence, MD 03/22/21 2245

## 2021-03-22 NOTE — ED Provider Notes (Signed)
Emergency Medicine Provider Triage Evaluation Note  Elizabeth Lam , a 20 y.o. female  was evaluated in triage.  Pt complains of left adnexal tenderness.  Started 3 days ago, associated with vaginal bleeding, vomiting.  Denies any previous abdominal or gynecologic surgeries.  She does have a history of PCOS, currently not on medication because she ran out of metformin.  She has not taken anything for pain.  Sitting down for too long seems to be an aggravating factor, unable to identify any alleviating factors.  Her last menstrual cycle was in October, patient took an over-the-counter pregnancy test which was positive a few days ago per the patient.  She was seen 02/23/2021 for similar presentation, she was negative at that time.Marland Kitchen  Ultrasound reviewed from 02/23/2021, there is no acute process at that time.  No ovarian cyst or masses.  Review of Systems  Positive: Above Negative: above  Physical Exam  BP (!) 129/102 (BP Location: Left Arm)    Pulse (!) 113    Temp 98.6 F (37 C) (Oral)    Ht 5\' 6"  (1.676 m)    Wt 98.9 kg    SpO2 92%    BMI 35.19 kg/m  Gen:   Uncomfortable appearing but nontoxic Resp:  Normal effort  MSK:   Moves extremities without difficulty  Other:  Left lower quadrant/pelvic tenderness.  Pelvic exam deferred till in her room.  She is also slightly tachycardic.  Medical Decision Making  Medically screening exam initiated at 9:15 PM.  Appropriate orders placed.  Elizabeth Lam was informed that the remainder of the evaluation will be completed by another provider, this initial triage assessment does not replace that evaluation, and the importance of remaining in the ED until their evaluation is complete.  Abdominal labs, will get urine pregnancy to confirm before ordering hCG quant.  Abdominal labs ordered.   Sherrill Raring, PA-C 03/22/21 2119    Isla Pence, MD 03/22/21 2127

## 2021-03-22 NOTE — ED Triage Notes (Signed)
Patient from home with complaint of vaginal bleeding that vegan 3 days ago. Pt reporting left lower abdominal pain. Pt used to take metformin for PCOS but states she ran out of her medication.

## 2021-03-23 LAB — GC/CHLAMYDIA PROBE AMP (~~LOC~~) NOT AT ARMC
Chlamydia: NEGATIVE
Comment: NEGATIVE
Comment: NORMAL
Neisseria Gonorrhea: NEGATIVE

## 2021-04-23 ENCOUNTER — Ambulatory Visit
Admission: EM | Admit: 2021-04-23 | Discharge: 2021-04-23 | Disposition: A | Discharge status: Home / Self Care | Payer: Medicaid Other | Attending: Physician Assistant | Admitting: Physician Assistant

## 2021-04-23 ENCOUNTER — Encounter: Payer: Self-pay | Admitting: Emergency Medicine

## 2021-04-23 ENCOUNTER — Other Ambulatory Visit: Payer: Self-pay

## 2021-04-23 DIAGNOSIS — N926 Irregular menstruation, unspecified: Secondary | ICD-10-CM | POA: Diagnosis not present

## 2021-04-23 DIAGNOSIS — Z3202 Encounter for pregnancy test, result negative: Secondary | ICD-10-CM

## 2021-04-23 LAB — POCT URINE PREGNANCY: Preg Test, Ur: NEGATIVE

## 2021-04-23 NOTE — ED Triage Notes (Signed)
Provider triaged patient

## 2021-04-23 NOTE — ED Provider Notes (Signed)
[name]    CSN: [id number] Arrival date & time: 04/23/21  1942      History   Chief Complaint Chief Complaint  Patient presents with   Possible Pregnancy    HPI Caralyn Twining is a 21 y.o. female.   Here today for evaluation of missed periods for the last 2 to 3 months.  She states she has had a menstrual cycle since October.  She also reports some breast tenderness as well as some nausea at times.  She denies any concerns for STDs.  She has not had any dysuria but does report some urinary frequency.  The history is provided by the patient.   Past Medical History:  Diagnosis Date   Anxiety    COVID-19 virus infection 04/2020   Depression    Difficulty sleeping    Morbid obesity (HCC)    OCD (obsessive compulsive disorder)    PCOS (polycystic ovarian syndrome)    Psychogenic nonepileptic seizure    PTSD (post-traumatic stress disorder)    Vaso vagal episode 2021    Patient Active Problem List   Diagnosis Date Noted   Morbid obesity ([place])    COVID-19 virus infection 04/2020   Psychogenic nonepileptic seizure 01/24/2020   Vaso vagal episode 2021   Insomnia    Vasovagal episode 10/30/2018   Seizure-like activity ([place]) 10/30/2018   Anxiety state 10/30/2018   MDD (major depressive disorder), recurrent severe, 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 Authorizing Provider  benzonatate (TESSALON) 200 MG capsule Take 1 capsule (200 mg total) by mouth 3 (three)  times daily as needed for cough. 02/28/21   Jacqlyn Larsen, PA-C  cetirizine (ZYRTEC) 10 MG tablet Take 1 tablet (10 mg total) by mouth daily as needed for allergies or rhinitis. 09/28/20   Kathie Dike, MD  EPINEPHrine 0.3 mg/0.3 mL IJ SOAJ injection Inject 0.3 mg into the muscle once as needed for anaphylaxis. 02/12/19   [provider]  ibuprofen (ADVIL) 600 MG tablet Take 1 tablet (600 mg total) by mouth every 6 (six) hours as needed. 03/22/21   Isla Pence, MD  metroNIDAZOLE (FLAGYL) 500 MG tablet Take 1 tablet (500 mg total) by mouth 2 (two) times daily. 03/22/21   Isla Pence, MD  ondansetron (ZOFRAN-ODT) 4 MG disintegrating tablet 4mg  ODT q4 hours prn nausea/vomit 02/28/21   Jacqlyn Larsen, PA-C  PROAIR HFA 108 [phone] Base) MCG/ACT inhaler Inhale 2 puffs into the lungs every 6 (six) hours as needed for wheezing or shortness of breath.    [provider]  triamcinolone cream (KENALOG) 0.1 % Apply 1 application topically 3 (three) times daily.    [provider]  ipratropium (ATROVENT) 0.06 % nasal spray Place 2 sprays into both nostrils 4 (four) times daily. 02/21/19 06/02/19  Ok Edwards, PA-C  metoCLOPramide (REGLAN) 10 MG tablet Take 1 tablet (10 mg total) by mouth every 6 (six) hours. 02/21/19 06/02/19  Tasia Catchings, Amy V, PA-C  prazosin (MINIPRESS) 2 MG capsule Take 1 capsule (2 mg total) by mouth at bedtime. 11/11/18 06/02/19  Toney Rakes, MD  SRONYX 0.1-20 MG-MCG tablet TAKE ONE TABLET BY MOUTH DAILY FOR 28 DAYS. 12/28/18 06/02/19  [provider]    Family History Family History  Problem Relation Age of Onset   Arrhythmia Mother        [place], has ICD   Healthy Father     Social History Social History   Tobacco Use   Smoking status: Never   Smokeless tobacco: Never  Vaping Use   Vaping Use: Some days   Substances: Nicotine  Substance Use Topics   Alcohol use: Never   Drug use: Yes    Types: Marijuana     Allergies   Bee venom, Fire ant,  Other, Red dye, Wasp venom, and Eucrisa [crisaborole]   Review of Systems Review of Systems  Constitutional:  Negative for chills and fever.  Eyes:  Negative for discharge and redness.  Respiratory:  Negative for shortness of breath.   Gastrointestinal:  Positive for nausea. Negative for abdominal pain and vomiting.  Genitourinary:  Positive for frequency and menstrual problem. Negative for dysuria, vaginal bleeding and vaginal discharge.    Physical Exam Triage Vital Signs ED Triage Vitals  Enc Vitals Group     BP      Pulse      Resp      Temp      Temp src      SpO2      Weight      Height      Head Circumference      Peak Flow      Pain Score      Pain Loc      Pain Edu?      Excl. in [place]?    No data found.  Updated Vital Signs BP 121/85 (BP Location: Left Arm)    Pulse 68    Temp 98.5 F (36.9 C) (Oral)    Resp 16    SpO2 98%       Physical Exam Vitals and nursing note reviewed.  Constitutional:      General: She is not in acute distress.    Appearance: Normal appearance. She is not ill-appearing.  HENT:     Head: Normocephalic and atraumatic.  Eyes:     Conjunctiva/sclera: Conjunctivae normal.  Cardiovascular:     Rate and Rhythm: Normal rate.  Pulmonary:     Effort: Pulmonary effort is normal.  Neurological:     Mental Status: She is alert.  Psychiatric:        Mood and Affect: Mood normal.        Behavior: Behavior normal.        Thought Content: Thought content normal.     UC Treatments / Results  Labs (all labs ordered are listed, but only abnormal results are displayed) Labs Reviewed  POCT URINE PREGNANCY    EKG   Radiology No results found.  Procedures Procedures (including critical care time)  Medications Ordered in UC Medications - No data to display  Initial Impression / Assessment and Plan / UC Course  I have reviewed the triage  vital signs and the nursing notes.  Pertinent labs & imaging results that were available  during my care of the patient were reviewed by me and considered in my medical decision making (see chart for details).  Pregnancy test negative in office.  Recommended she follow-up with her GYN and patient states she has an appointment in about 10 days for same.  Encouraged follow-up sooner with any further concerns.   Final Clinical Impressions(s) / UC Diagnoses   Final diagnoses:  Missed periods     Discharge Instructions      Pregnancy test negative in office. Recommend follow up with GYN.      ED Prescriptions   None    PDMP not reviewed this encounter.   Francene Finders, PA-C 04/23/21 2003

## 2021-04-23 NOTE — Discharge Instructions (Signed)
Pregnancy test negative in office. Recommend follow up with GYN.

## 2021-04-29 ENCOUNTER — Emergency Department (HOSPITAL_COMMUNITY)
Admission: EM | Admit: 2021-04-29 | Discharge: 2021-04-29 | Disposition: A | Discharge status: Home / Self Care | Payer: Medicaid Other | Attending: Emergency Medicine | Admitting: Emergency Medicine

## 2021-04-29 DIAGNOSIS — F10920 Alcohol use, unspecified with intoxication, uncomplicated: Secondary | ICD-10-CM | POA: Insufficient documentation

## 2021-04-29 DIAGNOSIS — Y906 Blood alcohol level of 120-199 mg/100 ml: Secondary | ICD-10-CM | POA: Diagnosis not present

## 2021-04-29 DIAGNOSIS — F1290 Cannabis use, unspecified, uncomplicated: Secondary | ICD-10-CM | POA: Diagnosis not present

## 2021-04-29 DIAGNOSIS — R569 Unspecified convulsions: Secondary | ICD-10-CM | POA: Insufficient documentation

## 2021-04-29 DIAGNOSIS — R1030 Lower abdominal pain, unspecified: Secondary | ICD-10-CM | POA: Diagnosis not present

## 2021-04-29 LAB — I-STAT CHEM 8, ED
BUN: 4 mg/dL — ABNORMAL LOW (ref 6–20)
Calcium, Ion: 1.1 mmol/L — ABNORMAL LOW (ref 1.15–1.40)
Chloride: 103 mmol/L (ref 98–111)
Creatinine, Ser: 1.1 mg/dL — ABNORMAL HIGH (ref 0.44–1.00)
Glucose, Bld: 88 mg/dL (ref 70–99)
HCT: 44 % (ref 36.0–46.0)
Hemoglobin: 15 g/dL (ref 12.0–15.0)
Potassium: 3.5 mmol/L (ref 3.5–5.1)
Sodium: 142 mmol/L (ref 135–145)
TCO2: 25 mmol/L (ref 22–32)

## 2021-04-29 LAB — COMPREHENSIVE METABOLIC PANEL
ALT: 18 U/L (ref 0–44)
AST: 24 U/L (ref 15–41)
Albumin: 4.1 g/dL (ref 3.5–5.0)
Alkaline Phosphatase: 52 U/L (ref 38–126)
Anion gap: 13 (ref 5–15)
BUN: 5 mg/dL — ABNORMAL LOW (ref 6–20)
CO2: 22 mmol/L (ref 22–32)
Calcium: 9.2 mg/dL (ref 8.9–10.3)
Chloride: 105 mmol/L (ref 98–111)
Creatinine, Ser: 1.04 mg/dL — ABNORMAL HIGH (ref 0.44–1.00)
GFR, Estimated: >60 mL/min (ref >60)
Glucose, Bld: 90 mg/dL (ref 70–99)
Potassium: 3.5 mmol/L (ref 3.5–5.1)
Sodium: 140 mmol/L (ref 135–145)
Total Bilirubin: 0.4 mg/dL (ref 0.3–1.2)
Total Protein: 6.9 g/dL (ref 6.5–8.1)

## 2021-04-29 LAB — ETHANOL: Alcohol, Ethyl (B): 122 mg/dL — ABNORMAL HIGH (ref <10)

## 2021-04-29 LAB — I-STAT BETA HCG BLOOD, ED (MC, WL, AP ONLY): I-stat hCG, quantitative: <5 m[IU]/mL (ref <5)

## 2021-04-29 LAB — LIPASE, BLOOD: Lipase: 21 U/L (ref 11–51)

## 2021-04-29 NOTE — Discharge Instructions (Signed)
Follow-up with your primary care physician for recheck.  Return to emergency room if you have any worsening symptoms.

## 2021-04-29 NOTE — ED Triage Notes (Signed)
Pt originally incalled as seizure activity x40 minutes. Pt able to speak upon arrival, erratic shaking movement appearing voluntary. Pt endorses etoh and smoking marijuana.

## 2021-04-29 NOTE — ED Provider Notes (Signed)
[facility] EMERGENCY DEPARTMENT Provider Note   CSN: [id number] Arrival date & time: 04/29/21  1949     History  Chief Complaint  Patient presents with   Abdominal Pain    Elizabeth Lam is a 21 y.o. female.  Patient is a 21 year old female who presents with possible seizure activity.  EMS was called for seizure activity that was witnessed at home.  She is having jerking movements of her extremities.  Per chart review, she has a history of polycystic ovarian syndrome, OCD, psychogenic seizures and PTSD.  EMS gave her 5 of Versed IM and another 2.5 IV without improvement of her symptoms.      Home Medications Prior to Admission medications   Medication Sig Start Date End Date Taking? Authorizing Provider  benzonatate (TESSALON) 200 MG capsule Take 1 capsule (200 mg total) by mouth 3 (three) times daily as needed for cough. 02/28/21   Jacqlyn Larsen, PA-C  cetirizine (ZYRTEC) 10 MG tablet Take 1 tablet (10 mg total) by mouth daily as needed for allergies or rhinitis. 09/28/20   Kathie Dike, MD  EPINEPHrine 0.3 mg/0.3 mL IJ SOAJ injection Inject 0.3 mg into the muscle once as needed for anaphylaxis. 02/12/19   [provider]  ibuprofen (ADVIL) 600 MG tablet Take 1 tablet (600 mg total) by mouth every 6 (six) hours as needed. 03/22/21   Isla Pence, MD  metroNIDAZOLE (FLAGYL) 500 MG tablet Take 1 tablet (500 mg total) by mouth 2 (two) times daily. 03/22/21   Isla Pence, MD  ondansetron (ZOFRAN-ODT) 4 MG disintegrating tablet 4mg  ODT q4 hours prn nausea/vomit 02/28/21   Jacqlyn Larsen, PA-C  PROAIR HFA 108 [phone] Base) MCG/ACT inhaler Inhale 2 puffs into the lungs every 6 (six) hours as needed for wheezing or shortness of breath.    [provider]  triamcinolone cream (KENALOG) 0.1 % Apply 1 application topically 3 (three) times daily.    [provider]  ipratropium (ATROVENT) 0.06 % nasal spray Place 2 sprays into both nostrils 4  (four) times daily. 02/21/19 06/02/19  Ok Edwards, PA-C  metoCLOPramide (REGLAN) 10 MG tablet Take 1 tablet (10 mg total) by mouth every 6 (six) hours. 02/21/19 06/02/19  Tasia Catchings, Amy V, PA-C  prazosin (MINIPRESS) 2 MG capsule Take 1 capsule (2 mg total) by mouth at bedtime. 11/11/18 06/02/19  Toney Rakes, MD  SRONYX 0.1-20 MG-MCG tablet TAKE ONE TABLET BY MOUTH DAILY FOR 28 DAYS. 12/28/18 06/02/19  [provider]      Allergies    Bee venom, Fire ant, Other, Red dye, Wasp venom, and Eucrisa [crisaborole]    Review of Systems   Review of Systems  Constitutional:  Negative for chills, diaphoresis, fatigue and fever.  HENT:  Negative for congestion, rhinorrhea and sneezing.   Eyes: Negative.   Respiratory:  Negative for cough, chest tightness and shortness of breath.   Cardiovascular:  Negative for chest pain and leg swelling.  Gastrointestinal:  Positive for abdominal pain and nausea. Negative for blood in stool, diarrhea and vomiting.  Genitourinary:  Negative for difficulty urinating, flank pain, frequency and hematuria.  Musculoskeletal:  Negative for arthralgias and back pain.  Skin:  Negative for rash.  Neurological:  Negative for dizziness, speech difficulty, weakness, numbness and headaches.  Psychiatric/Behavioral:  The patient is nervous/anxious.    Physical Exam Updated Vital Signs BP 109/65    Pulse 64    Resp 18    SpO2 99%  Physical Exam  Constitutional:      Appearance: She is well-developed.  HENT:     Head: Normocephalic and atraumatic.  Eyes:     Pupils: Pupils are equal, round, and reactive to light.  Cardiovascular:     Rate and Rhythm: Normal rate and regular rhythm.     Heart sounds: Normal heart sounds.  Pulmonary:     Effort: Pulmonary effort is normal. No respiratory distress.     Breath sounds: Normal breath sounds. No wheezing or rales.  Chest:     Chest wall: No tenderness.  Abdominal:     General: Bowel sounds are normal.     Palpations:  Abdomen is soft.     Tenderness: There is no abdominal tenderness. There is no guarding or rebound.  Musculoskeletal:        General: Normal range of motion.     Cervical back: Normal range of motion and neck supple.  Lymphadenopathy:     Cervical: No cervical adenopathy.  Skin:    General: Skin is warm and dry.     Findings: No rash.  Neurological:     Mental Status: She is alert and oriented to person, place, and time.    ED Results / Procedures / Treatments   Labs (all labs ordered are listed, but only abnormal results are displayed) Labs Reviewed  COMPREHENSIVE METABOLIC PANEL - Abnormal; Notable for the following components:      Result Value   BUN 5 (*)    Creatinine, Ser 1.04 (*)    All other components within normal limits  ETHANOL - Abnormal; Notable for the following components:   Alcohol, Ethyl (B) 122 (*)    All other components within normal limits  I-STAT CHEM 8, ED - Abnormal; Notable for the following components:   BUN 4 (*)    Creatinine, Ser 1.10 (*)    Calcium, Ion 1.10 (*)    All other components within normal limits  LIPASE, BLOOD  CBC WITH DIFFERENTIAL/PLATELET  URINALYSIS, ROUTINE W REFLEX MICROSCOPIC  RAPID URINE DRUG SCREEN, HOSP PERFORMED  CBC WITH DIFFERENTIAL/PLATELET  I-STAT BETA HCG BLOOD, ED ([facility], [facility], [facility] ONLY)    EKG None  Radiology No results found.  Procedures Procedures    Medications Ordered in ED Medications - No data to display  ED Course/ Medical Decision Making/ A&P                           Medical Decision Making Amount and/or Complexity of Data Reviewed Labs: ordered.   Patient presents with possible seizure activity.  However on arrival patient is having jerking movements of her extremities but is able to talk to me and is really complaining of lower abdominal pain.  I asked her if she is had anxiety and she said yes.  She also says that she has been drinking some alcohol and smoking marijuana.  She is concerned  that she may be pregnant because she is having lower abdominal pain and has not have a period in some time.  She also feels like her breasts are larger than normal.  She does not report any other recent illnesses.  Per chart review, she has been seen multiple times for seizure-like activity and per documentation at [facility], she has had multiple evaluations for this including MRIs and EEGs that have not documented any abnormal findings.  She is not on antiepileptic medications.  It also seems that she has had a lot of  visits recently for concern that she is pregnant with subsequent negative pregnancy test.  Patient had no further episodes while she was here in the ED.  Her labs were drawn and assessed.  Her EtOH level is elevated which could explain some of her symptoms.  Her pregnancy test is negative.  Her creatinine is minimally elevated but similar to prior values.  LFTs are normal.  Her CBC hemolyzed and I discussed with the patient and her mom who is at bedside about have it redrawn but patient at this point is feeling much better and wants to go home.  She does not want to stay any longer to have this done.  I did review her records and she had one drawn in December which looked normal.  She has no ongoing abdominal pain.  No neurologic deficits.  She was discharged home in good condition.  She was encouraged to follow-up with her PCP.  Return precautions were given.  Final Clinical Impression(s) / ED Diagnoses Final diagnoses:  Alcoholic intoxication without complication [facility])    Rx / [place] Orders ED Discharge Orders     None         Malvin Johns, MD 04/29/21 2315

## 2021-05-05 ENCOUNTER — Other Ambulatory Visit: Payer: Self-pay

## 2021-05-05 ENCOUNTER — Encounter (HOSPITAL_COMMUNITY): Payer: Self-pay | Admitting: Emergency Medicine

## 2021-05-05 ENCOUNTER — Emergency Department (HOSPITAL_COMMUNITY)
Admission: EM | Admit: 2021-05-05 | Discharge: 2021-05-06 | Disposition: A | Discharge status: Home / Self Care | Payer: Medicaid Other | Attending: Emergency Medicine | Admitting: Emergency Medicine

## 2021-05-05 DIAGNOSIS — R1031 Right lower quadrant pain: Secondary | ICD-10-CM | POA: Diagnosis present

## 2021-05-05 DIAGNOSIS — R350 Frequency of micturition: Secondary | ICD-10-CM | POA: Insufficient documentation

## 2021-05-05 DIAGNOSIS — R112 Nausea with vomiting, unspecified: Secondary | ICD-10-CM | POA: Insufficient documentation

## 2021-05-05 DIAGNOSIS — N898 Other specified noninflammatory disorders of vagina: Secondary | ICD-10-CM | POA: Diagnosis not present

## 2021-05-05 DIAGNOSIS — R911 Solitary pulmonary nodule: Secondary | ICD-10-CM | POA: Insufficient documentation

## 2021-05-05 DIAGNOSIS — R109 Unspecified abdominal pain: Secondary | ICD-10-CM

## 2021-05-05 NOTE — ED Triage Notes (Signed)
Patient presents lower abdominal pain, states clear, mucous discharge, hx of PCOS. No N/V

## 2021-05-06 ENCOUNTER — Emergency Department (HOSPITAL_COMMUNITY): Payer: Medicaid Other

## 2021-05-06 ENCOUNTER — Encounter (HOSPITAL_COMMUNITY): Payer: Self-pay | Admitting: Student

## 2021-05-06 LAB — CBC
HCT: 42.4 % (ref 36.0–46.0)
Hemoglobin: 13.6 g/dL (ref 12.0–15.0)
MCH: 29.2 pg (ref 26.0–34.0)
MCHC: 32.1 g/dL (ref 30.0–36.0)
MCV: 91 fL (ref 80.0–100.0)
Platelets: 315 10*3/uL (ref 150–400)
RBC: 4.66 MIL/uL (ref 3.87–5.11)
RDW: 14.2 % (ref 11.5–15.5)
WBC: 6.8 10*3/uL (ref 4.0–10.5)
nRBC: 0 % (ref 0.0–0.2)

## 2021-05-06 LAB — LIPASE, BLOOD: Lipase: 24 U/L (ref 11–51)

## 2021-05-06 LAB — COMPREHENSIVE METABOLIC PANEL
ALT: 14 U/L (ref 0–44)
AST: 21 U/L (ref 15–41)
Albumin: 4.4 g/dL (ref 3.5–5.0)
Alkaline Phosphatase: 55 U/L (ref 38–126)
Anion gap: 6 (ref 5–15)
BUN: 11 mg/dL (ref 6–20)
CO2: 29 mmol/L (ref 22–32)
Calcium: 9.5 mg/dL (ref 8.9–10.3)
Chloride: 104 mmol/L (ref 98–111)
Creatinine, Ser: 0.92 mg/dL (ref 0.44–1.00)
GFR, Estimated: >60 mL/min (ref >60)
Glucose, Bld: 73 mg/dL (ref 70–99)
Potassium: 4 mmol/L (ref 3.5–5.1)
Sodium: 139 mmol/L (ref 135–145)
Total Bilirubin: 0.5 mg/dL (ref 0.3–1.2)
Total Protein: 7.6 g/dL (ref 6.5–8.1)

## 2021-05-06 LAB — URINALYSIS, ROUTINE W REFLEX MICROSCOPIC
Bacteria, UA: NONE SEEN
Bilirubin Urine: NEGATIVE
Glucose, UA: NEGATIVE mg/dL
Hgb urine dipstick: NEGATIVE
Ketones, ur: NEGATIVE mg/dL
Leukocytes,Ua: NEGATIVE
Nitrite: NEGATIVE
Protein, ur: NEGATIVE mg/dL
Specific Gravity, Urine: 1.027 (ref 1.005–1.030)
pH: 6 (ref 5.0–8.0)

## 2021-05-06 LAB — WET PREP, GENITAL
Sperm: NONE SEEN
Trich, Wet Prep: NONE SEEN
WBC, Wet Prep HPF POC: <10 (ref <10)
Yeast Wet Prep HPF POC: NONE SEEN

## 2021-05-06 LAB — POC URINE PREG, ED: Preg Test, Ur: NEGATIVE

## 2021-05-06 MED ORDER — KETOROLAC TROMETHAMINE 15 MG/ML IJ SOLN: 15.0000 mg | Freq: Once | INTRAMUSCULAR | Status: DC

## 2021-05-06 MED ORDER — METRONIDAZOLE 500 MG PO TABS: 500.0000 mg | ORAL_TABLET | Freq: Two times a day (BID) | ORAL | 0 refills | Status: DC

## 2021-05-06 MED ORDER — ONDANSETRON HCL 4 MG/2ML IJ SOLN
4.0000 mg | Freq: Once | INTRAMUSCULAR | Status: AC
  Administered 2021-05-06: 4 mg via INTRAVENOUS
  Filled 2021-05-06: qty 2

## 2021-05-06 MED ORDER — ONDANSETRON 4 MG PO TBDP: 4.0000 mg | ORAL_TABLET | Freq: Three times a day (TID) | ORAL | 0 refills | Status: DC | PRN

## 2021-05-06 MED ORDER — NAPROXEN 500 MG PO TABS: 500.0000 mg | ORAL_TABLET | Freq: Two times a day (BID) | ORAL | 0 refills | Status: DC | PRN

## 2021-05-06 MED ORDER — IOHEXOL 300 MG/ML  SOLN
100.0000 mL | Freq: Once | INTRAMUSCULAR | Status: AC | PRN
  Administered 2021-05-06: 100 mL via INTRAVENOUS

## 2021-05-06 MED ORDER — SODIUM CHLORIDE 0.9 % IV BOLUS
1000.0000 mL | Freq: Once | INTRAVENOUS | Status: AC
  Administered 2021-05-06: 1000 mL via INTRAVENOUS

## 2021-05-06 MED ORDER — FENTANYL CITRATE PF 50 MCG/ML IJ SOSY
50.0000 ug | PREFILLED_SYRINGE | Freq: Once | INTRAMUSCULAR | Status: AC
  Administered 2021-05-06: 50 ug via INTRAVENOUS
  Filled 2021-05-06: qty 1

## 2021-05-06 NOTE — ED Notes (Signed)
Patient transported to CT 

## 2021-05-06 NOTE — Discharge Instructions (Addendum)
You were seen in the ER today for abdominal pain.  Your labs were overall reassuring.  Your CT scan did not show any significant abnormalities in your abdomen/pelvis, it did show a very small lung nodule that the radiologist felt was likely from prior inflammation and did not recommend any further imaging for this- please discuss this with your primary care provider.    Your wet prep (pelvic sample) showed clue cells which can indicate bacterial vaginosis, with your increased discharge we are treating this with flagyl which is an antibiotic. Do NOT drink alcohol with this medicine as it can make you dangerously sick.   Please take zofran as needed for nausea/vomiting and naproxen as needed for pain.   - Naproxen- this is a nonsteroidal anti-inflammatory medication that will help with pain and swelling. Be sure to take this medication as prescribed with food, 1 pill every 12 hours,  It should be taken with food, as it can cause stomach upset, and more seriously, stomach bleeding. Do not take other nonsteroidal anti-inflammatory medications with this such as Advil, Motrin, Aleve, Mobic, Goodie Powder, or Motrin etc..    You make take Tylenol per over the counter dosing with these medications.   We have prescribed you new medication(s) today. Discuss the medications prescribed today with your pharmacist as they can have adverse effects and interactions with your other medicines including over the counter and prescribed medications. Seek medical evaluation if you start to experience new or abnormal symptoms after taking one of these medicines, seek care immediately if you start to experience difficulty breathing, feeling of your throat closing, facial swelling, or rash as these could be indications of a more serious allergic reaction   Please follow up with your primary care provider and your gynecologist, we have also provided our women's center information. Follow up as soon as possible.  Return to  the ER for new or worsening symptoms including but not limited to new or worsening pain, inability to keep fluids down, fever, blood in vomit/stool, or any other concerns.

## 2021-05-06 NOTE — ED Provider Notes (Signed)
[facility] DEPT Provider Note   CSN: [id number] Arrival date & time: 05/05/21  2340     History  Chief Complaint  Patient presents with   Abdominal Pain    Elizabeth Lam is a 21 y.o. female with a hx of PCOS, OCD, PTSD, anxiety, and depression who presents to the ED with complaints of abdominal pain x 4 days. Pain is generalized, more to the right lower abdomen, sometimes radiates into her back, it waxes/wanes, improved in certain positions, no other alleviating/aggravating factors. Has associated nausea, vomiting, & urinary frequency. She has noted some increase in her typical clear vaginal discharge, however it is the same consistency. She is sexually active w/ 1 partner, denies concern for STI. Denies fever, diarrhea, melena, dysuria, or vaginal bleeding. States this pain feels different than her PCOS.   HPI     Home Medications Prior to Admission medications   Medication Sig Start Date End Date Taking? Authorizing Provider  benzonatate (TESSALON) 200 MG capsule Take 1 capsule (200 mg total) by mouth 3 (three) times daily as needed for cough. 02/28/21   Jacqlyn Larsen, PA-C  cetirizine (ZYRTEC) 10 MG tablet Take 1 tablet (10 mg total) by mouth daily as needed for allergies or rhinitis. 09/28/20   Kathie Dike, MD  EPINEPHrine 0.3 mg/0.3 mL IJ SOAJ injection Inject 0.3 mg into the muscle once as needed for anaphylaxis. 02/12/19   [provider]  ibuprofen (ADVIL) 600 MG tablet Take 1 tablet (600 mg total) by mouth every 6 (six) hours as needed. 03/22/21   Isla Pence, MD  metroNIDAZOLE (FLAGYL) 500 MG tablet Take 1 tablet (500 mg total) by mouth 2 (two) times daily. 03/22/21   Isla Pence, MD  ondansetron (ZOFRAN-ODT) 4 MG disintegrating tablet 4mg  ODT q4 hours prn nausea/vomit 02/28/21   Jacqlyn Larsen, PA-C  PROAIR HFA 108 [phone] Base) MCG/ACT inhaler Inhale 2 puffs into the lungs every 6 (six) hours as needed for wheezing or shortness  of breath.    [provider]  triamcinolone cream (KENALOG) 0.1 % Apply 1 application topically 3 (three) times daily.    [provider]  ipratropium (ATROVENT) 0.06 % nasal spray Place 2 sprays into both nostrils 4 (four) times daily. 02/21/19 06/02/19  Ok Edwards, PA-C  metoCLOPramide (REGLAN) 10 MG tablet Take 1 tablet (10 mg total) by mouth every 6 (six) hours. 02/21/19 06/02/19  Tasia Catchings, Amy V, PA-C  prazosin (MINIPRESS) 2 MG capsule Take 1 capsule (2 mg total) by mouth at bedtime. 11/11/18 06/02/19  Toney Rakes, MD  SRONYX 0.1-20 MG-MCG tablet TAKE ONE TABLET BY MOUTH DAILY FOR 28 DAYS. 12/28/18 06/02/19  [provider]      Allergies    Bee venom, Fire ant, Other, Red dye, Wasp venom, and Eucrisa [crisaborole]    Review of Systems   Review of Systems  Constitutional:  Negative for chills and fever.  Respiratory:  Negative for shortness of breath.   Cardiovascular:  Negative for chest pain.  Gastrointestinal:  Positive for abdominal pain, nausea and vomiting. Negative for blood in stool and diarrhea.  Genitourinary:  Positive for vaginal discharge. Negative for dysuria and vaginal bleeding.  Musculoskeletal:  Positive for back pain.  Neurological:  Negative for syncope, weakness and numbness.  All other systems reviewed and are negative.  Physical Exam Updated Vital Signs BP (!) 106/55    Pulse (!) 57    Temp 97.7 F (36.5 C) (Oral)    Resp  17    Ht 5\' 6"  (1.676 m)    Wt 98.9 kg    SpO2 97%    BMI 35.19 kg/m  Physical Exam Vitals and nursing note reviewed. Exam conducted with a chaperone present.  Constitutional:      General: She is not in acute distress.    Appearance: She is well-developed. She is not toxic-appearing.  HENT:     Head: Normocephalic and atraumatic.  Eyes:     General:        Right eye: No discharge.        Left eye: No discharge.     Conjunctiva/sclera: Conjunctivae normal.  Cardiovascular:     Rate and Rhythm: Normal rate and  regular rhythm.  Pulmonary:     Effort: Pulmonary effort is normal. No respiratory distress.     Breath sounds: Normal breath sounds. No wheezing, rhonchi or rales.  Abdominal:     General: There is no distension.     Palpations: Abdomen is soft.     Tenderness: There is generalized abdominal tenderness (most prominent in the RLQ). There is no guarding or rebound.  Genitourinary:    Comments: No external lesions. Mild white discharge present. No CMT or adnexal tenderness.   RN Preyer present as chaperone.  Musculoskeletal:     Cervical back: Neck supple.     Comments: Diffuse lumbar tenderness without point/focal verterbral tenderness.   Skin:    General: Skin is warm and dry.     Findings: No rash.  Neurological:     Mental Status: She is alert.     Comments: Sensation/strength grossly intact to [place]. Ambulatory. Clear speech.   Psychiatric:        Behavior: Behavior normal.    ED Results / Procedures / Treatments   Labs (all labs ordered are listed, but only abnormal results are displayed) Labs Reviewed  WET PREP, GENITAL - Abnormal; Notable for the following components:      Result Value   Clue Cells Wet Prep HPF POC PRESENT (*)    All other components within normal limits  URINALYSIS, ROUTINE W REFLEX MICROSCOPIC - Abnormal; Notable for the following components:   APPearance HAZY (*)    All other components within normal limits  CBC  COMPREHENSIVE METABOLIC PANEL  LIPASE, BLOOD  I-STAT BETA HCG BLOOD, ED ([facility], [facility], [facility] ONLY)  POC URINE PREG, ED  GC/CHLAMYDIA PROBE AMP ([place]) NOT AT [facility]    EKG None  Radiology CT Abdomen Pelvis W Contrast  Result Date: 05/06/2021 CLINICAL DATA:  Right lower quadrant pain EXAM: CT ABDOMEN AND PELVIS WITH CONTRAST TECHNIQUE: Multidetector CT imaging of the abdomen and pelvis was performed using the standard protocol following bolus administration of intravenous contrast. RADIATION DOSE REDUCTION: This exam was performed according  to the departmental dose-optimization program which includes automated exposure control, adjustment of the mA and/or kV according to patient size and/or use of iterative reconstruction technique. CONTRAST:  113mL OMNIPAQUE IOHEXOL 300 MG/ML  SOLN COMPARISON:  None. FINDINGS: Lower chest: Lung bases are free of acute infiltrate or sizable effusion. Tiny 3 mm nodule is noted in the lateral aspect of the right lower lobe best seen on image number 18 of series 4. Hepatobiliary: No focal liver abnormality is seen. No gallstones, gallbladder wall thickening, or biliary dilatation. Pancreas: Unremarkable. No pancreatic ductal dilatation or surrounding inflammatory changes. Spleen: Normal in size without focal abnormality. Adrenals/Urinary Tract: Adrenal glands are within normal limits. Kidneys demonstrate a normal enhancement pattern  bilaterally. No renal calculi are noted. No obstructive changes are seen. The bladder is partially distended. Stomach/Bowel: No obstructive or inflammatory changes of the colon are noted. The appendix is not discretely visualized although no inflammatory changes to suggest appendicitis are seen. Small bowel and stomach are within normal limits. Vascular/Lymphatic: No significant vascular findings are present. No enlarged abdominal or pelvic lymph nodes. Reproductive: Uterus is retroflexed.  No adnexal mass is noted. Other: No abdominal wall hernia or abnormality. No abdominopelvic ascites. Musculoskeletal: No acute or significant osseous findings. IMPRESSION: No acute abnormality is noted to correspond with the patient's given clinical history. 3 mm right lower lobe nodule. This likely postinflammatory in nature. No follow-up is recommended. Electronically Signed   By: Inez Catalina M.D.   On: 05/06/2021 03:29    Procedures Procedures    Medications Ordered in ED Medications  sodium chloride 0.9 % bolus 1,000 mL (0 mLs Intravenous Stopped 05/06/21 0254)  ondansetron (ZOFRAN) injection 4  mg (4 mg Intravenous Given 05/06/21 0147)  fentaNYL (SUBLIMAZE) injection 50 mcg (50 mcg Intravenous Given 05/06/21 0147)  iohexol (OMNIPAQUE) 300 MG/ML solution 100 mL (100 mLs Intravenous Contrast Given 05/06/21 0258)    ED Course/ Medical Decision Making/ A&P                           Medical Decision Making Amount and/or Complexity of Data Reviewed Labs: ordered. Radiology: ordered.  Risk Prescription drug management.  Patient presents to the ED with complaints of abdominal pain, this involves an extensive number of treatment options, and is a complaint that carries with it a high risk of complications and morbidity. Nontoxic, vitals without significant abnormality on arrival.  On exam patient has generalized abdominal tenderness that seems most prominent in the right lower quadrant, no peritoneal signs.  Pelvic exam was performed, white discharge noted, however patient has no cervical motion tenderness or adnexal tenderness.. Diffuse lumbar tenderness. No neuro deficits.   Ddx including but not limited to appendicitis, ovarian cyst, ovarian torsion, pyelonephritis/UTI, PUD, ectopic pregnancy, pain related to pregnancy, constipation, viral GI illness.  Additional history obtained:  Additional history obtained from chart review and nursing note review  Lab Tests:  I Ordered, reviewed, and interpreted labs, pertinent results include:  CBC, CMP, lipase, urinalysis, pregnancy test: Unremarkable, no leukocytosis, electrolyte derangement, hepatic dysfunction, renal dysfunction, or urinary tract infection.  Pregnancy test is negative. Wet prep: Clue cells present GC/chlamydia: Pending  I ordered Zofran for nausea and fentanyl for pain as well as 1 L of normal saline for hydration.  Imaging Studies ordered:  I ordered imaging studies which included CT abdomen/pelvis with contrast,  I independently reviewed & interpreted imaging & am in agreement with radiology impression which shows: No  acute abnormality is noted to correspond with the patient's given clinical history. 3 mm right lower lobe nodule. This likely postinflammatory in nature. No follow-up is recommended.   ED Course:  03:45: RE-EVAL: Patient feeling improved on reassessment, she is resting comfortably, she is able to tolerate p.o.  Abdominal exams in the ER remained without peritoneal signs, CT scan overall reassuring, overall low suspicion for appendicitis, ovarian torsion, perforation, obstruction, ectopic pregnancy, or other acute surgical process.  Pelvic exam did not seem consistent with PUD.  Given she has had increased vaginal discharge we will treat for BV given clue cells on wet prep, treatment with Flagyl.  Will trial naproxen as needed for pain and advised patient to follow-up  closely with her primary care provider as well as her GYN. I discussed results, treatment plan, need for follow-up, and return precautions with the patient. Provided opportunity for questions, patient confirmed understanding and is in agreement with plan.   Portions of this note were generated with [employer]. Dictation errors may occur despite best attempts at proofreading.  Final Clinical Impression(s) / ED Diagnoses Final diagnoses:  Abdominal pain, unspecified abdominal location    Rx / DC Orders ED Discharge Orders          Ordered    naproxen (NAPROSYN) 500 MG tablet  2 times daily PRN        05/06/21 0403    ondansetron (ZOFRAN-ODT) 4 MG disintegrating tablet  Every 8 hours PRN        05/06/21 0403    metroNIDAZOLE (FLAGYL) 500 MG tablet  2 times daily        05/06/21 0403              Amra Shukla, Glynda Jaeger, PA-C 05/06/21 0404    Quintella Reichert, MD 05/06/21 [phone]

## 2021-05-07 LAB — GC/CHLAMYDIA PROBE AMP (~~LOC~~) NOT AT ARMC
Chlamydia: NEGATIVE
Comment: NEGATIVE
Comment: NORMAL
Neisseria Gonorrhea: NEGATIVE

## 2021-06-03 ENCOUNTER — Emergency Department (HOSPITAL_COMMUNITY): Payer: Medicaid Other

## 2021-06-03 ENCOUNTER — Encounter (HOSPITAL_COMMUNITY): Payer: Self-pay | Admitting: Emergency Medicine

## 2021-06-03 ENCOUNTER — Emergency Department (HOSPITAL_COMMUNITY)
Admission: EM | Admit: 2021-06-03 | Discharge: 2021-06-04 | Disposition: A | Discharge status: Home / Self Care | Payer: Medicaid Other | Attending: Emergency Medicine | Admitting: Emergency Medicine

## 2021-06-03 DIAGNOSIS — M546 Pain in thoracic spine: Secondary | ICD-10-CM | POA: Diagnosis not present

## 2021-06-03 DIAGNOSIS — S39012A Strain of muscle, fascia and tendon of lower back, initial encounter: Secondary | ICD-10-CM | POA: Insufficient documentation

## 2021-06-03 DIAGNOSIS — R7989 Other specified abnormal findings of blood chemistry: Secondary | ICD-10-CM | POA: Insufficient documentation

## 2021-06-03 DIAGNOSIS — R1013 Epigastric pain: Secondary | ICD-10-CM | POA: Insufficient documentation

## 2021-06-03 DIAGNOSIS — S3992XA Unspecified injury of lower back, initial encounter: Secondary | ICD-10-CM | POA: Diagnosis present

## 2021-06-03 DIAGNOSIS — X58XXXA Exposure to other specified factors, initial encounter: Secondary | ICD-10-CM | POA: Insufficient documentation

## 2021-06-03 LAB — URINALYSIS, ROUTINE W REFLEX MICROSCOPIC
Bacteria, UA: NONE SEEN
Bilirubin Urine: NEGATIVE
Glucose, UA: NEGATIVE mg/dL
Hgb urine dipstick: NEGATIVE
Ketones, ur: 5 mg/dL — AB
Nitrite: NEGATIVE
Protein, ur: 30 mg/dL — AB
Specific Gravity, Urine: 1.028 (ref 1.005–1.030)
pH: 5 (ref 5.0–8.0)

## 2021-06-03 LAB — I-STAT BETA HCG BLOOD, ED (MC, WL, AP ONLY): I-stat hCG, quantitative: <5 m[IU]/mL (ref <5)

## 2021-06-03 LAB — COMPREHENSIVE METABOLIC PANEL
ALT: 14 U/L (ref 0–44)
AST: 21 U/L (ref 15–41)
Albumin: 4.3 g/dL (ref 3.5–5.0)
Alkaline Phosphatase: 54 U/L (ref 38–126)
Anion gap: 5 (ref 5–15)
BUN: 11 mg/dL (ref 6–20)
CO2: 28 mmol/L (ref 22–32)
Calcium: 9.8 mg/dL (ref 8.9–10.3)
Chloride: 107 mmol/L (ref 98–111)
Creatinine, Ser: 1.09 mg/dL — ABNORMAL HIGH (ref 0.44–1.00)
GFR, Estimated: >60 mL/min (ref >60)
Glucose, Bld: 87 mg/dL (ref 70–99)
Potassium: 3.9 mmol/L (ref 3.5–5.1)
Sodium: 140 mmol/L (ref 135–145)
Total Bilirubin: 0.4 mg/dL (ref 0.3–1.2)
Total Protein: 7.5 g/dL (ref 6.5–8.1)

## 2021-06-03 LAB — WET PREP, GENITAL
Clue Cells Wet Prep HPF POC: NONE SEEN
Sperm: NONE SEEN
Trich, Wet Prep: NONE SEEN
WBC, Wet Prep HPF POC: <10 (ref <10)
Yeast Wet Prep HPF POC: NONE SEEN

## 2021-06-03 LAB — LIPASE, BLOOD: Lipase: 29 U/L (ref 11–51)

## 2021-06-03 LAB — CBC WITH DIFFERENTIAL/PLATELET
Abs Immature Granulocytes: 0.02 10*3/uL (ref 0.00–0.07)
Basophils Absolute: 0 10*3/uL (ref 0.0–0.1)
Basophils Relative: 1 %
Eosinophils Absolute: 0.2 10*3/uL (ref 0.0–0.5)
Eosinophils Relative: 2 %
HCT: 40.3 % (ref 36.0–46.0)
Hemoglobin: 13.4 g/dL (ref 12.0–15.0)
Immature Granulocytes: 0 %
Lymphocytes Relative: 43 %
Lymphs Abs: 3.5 10*3/uL (ref 0.7–4.0)
MCH: 29.5 pg (ref 26.0–34.0)
MCHC: 33.3 g/dL (ref 30.0–36.0)
MCV: 88.8 fL (ref 80.0–100.0)
Monocytes Absolute: 0.8 10*3/uL (ref 0.1–1.0)
Monocytes Relative: 9 %
Neutro Abs: 3.6 10*3/uL (ref 1.7–7.7)
Neutrophils Relative %: 45 %
Platelets: 300 10*3/uL (ref 150–400)
RBC: 4.54 MIL/uL (ref 3.87–5.11)
RDW: 13.6 % (ref 11.5–15.5)
WBC: 8 10*3/uL (ref 4.0–10.5)
nRBC: 0 % (ref 0.0–0.2)

## 2021-06-03 MED ORDER — IOHEXOL 300 MG/ML  SOLN
100.0000 mL | Freq: Once | INTRAMUSCULAR | Status: AC | PRN
  Administered 2021-06-03: 100 mL via INTRAVENOUS

## 2021-06-03 MED ORDER — METHOCARBAMOL 500 MG PO TABS: 500.0000 mg | ORAL_TABLET | Freq: Two times a day (BID) | ORAL | 0 refills | Status: DC

## 2021-06-03 NOTE — ED Provider Notes (Signed)
[facility] DEPT Provider Note   CSN: [id number] Arrival date & time: 06/03/21  2041     History  Chief Complaint  Patient presents with   Back Pain    Elizabeth Lam is a 21 y.o. female who presents to the emergency department complaining of back pain onset 1 week.  She notes that her back pain radiates down her left leg.  Denies recent injury, heavy lifting, trauma.  Has associated mid to upper abdominal pain, low-grade fever (max temp 99).  Started progesterone on 05/11/21 for 10 days. Then had a period on the 05/21/21. Has an appointment with OB on 06/11/21 for follow up.  Has not tried any medications for his symptoms.  Patient voices that she would like STI testing at this time.  She is sexually active with 1 female partner with unprotected intercourse. Denies fever, chills, nausea, vomiting, dysuria, hematuria, abdominal pain, saddle paresthesia, numbness, tingling, or weakness.    The history is provided by the patient. No language interpreter was used.      Home Medications Prior to Admission medications   Medication Sig Start Date End Date Taking? Authorizing Provider  methocarbamol (ROBAXIN) 500 MG tablet Take 1 tablet (500 mg total) by mouth 2 (two) times daily. 06/03/21  Yes Rhealyn Cullen A, PA-C  benzonatate (TESSALON) 200 MG capsule Take 1 capsule (200 mg total) by mouth 3 (three) times daily as needed for cough. 02/28/21   Jacqlyn Larsen, PA-C  cetirizine (ZYRTEC) 10 MG tablet Take 1 tablet (10 mg total) by mouth daily as needed for allergies or rhinitis. 09/28/20   Kathie Dike, MD  EPINEPHrine 0.3 mg/0.3 mL IJ SOAJ injection Inject 0.3 mg into the muscle once as needed for anaphylaxis. 02/12/19   [provider]  metroNIDAZOLE (FLAGYL) 500 MG tablet Take 1 tablet (500 mg total) by mouth 2 (two) times daily. 05/06/21   Petrucelli, Samantha R, PA-C  naproxen (NAPROSYN) 500 MG tablet Take 1 tablet (500 mg total) by mouth 2 (two) times daily  as needed for moderate pain. 05/06/21   Petrucelli, Samantha R, PA-C  ondansetron (ZOFRAN-ODT) 4 MG disintegrating tablet Take 1 tablet (4 mg total) by mouth every 8 (eight) hours as needed for nausea or vomiting. 05/06/21   Petrucelli, Samantha R, PA-C  PROAIR HFA 108 (90 Base) MCG/ACT inhaler Inhale 2 puffs into the lungs every 6 (six) hours as needed for wheezing or shortness of breath.    [provider]  triamcinolone cream (KENALOG) 0.1 % Apply 1 application topically 3 (three) times daily.    [provider]  ipratropium (ATROVENT) 0.06 % nasal spray Place 2 sprays into both nostrils 4 (four) times daily. 02/21/19 06/02/19  Ok Edwards, PA-C  metoCLOPramide (REGLAN) 10 MG tablet Take 1 tablet (10 mg total) by mouth every 6 (six) hours. 02/21/19 06/02/19  Tasia Catchings, Amy V, PA-C  prazosin (MINIPRESS) 2 MG capsule Take 1 capsule (2 mg total) by mouth at bedtime. 11/11/18 06/02/19  Toney Rakes, MD  SRONYX 0.1-20 MG-MCG tablet TAKE ONE TABLET BY MOUTH DAILY FOR 28 DAYS. 12/28/18 06/02/19  [provider]      Allergies    Bee venom, Fire ant, Other, Red dye, Wasp venom, and Eucrisa [crisaborole]    Review of Systems   Review of Systems  Constitutional:  Positive for fever (low-grade fever 99). Negative for chills.  Gastrointestinal:  Positive for abdominal pain (mid to upper), nausea and vomiting.       -  bowel incontinence  Genitourinary:  Negative for dysuria, hematuria, vaginal bleeding and vaginal discharge.       -bladder incontinence  Musculoskeletal:  Positive for back pain (mid to lower). Negative for arthralgias.  Skin:  Negative for color change and wound.  Neurological:  Negative for weakness and numbness.       -tingling  All other systems reviewed and are negative.  Physical Exam Updated Vital Signs BP 130/81 (BP Location: Left Arm)    Pulse 78    Temp 98.3 F (36.8 C) (Oral)    Resp 18    Ht 5\' 6"  (1.676 m)    Wt 99.8 kg    LMP 05/21/2021    SpO2 99%    BMI  35.51 kg/m  Physical Exam Vitals and nursing note reviewed.  Constitutional:      General: She is not in acute distress.    Appearance: She is not diaphoretic.  HENT:     Head: Normocephalic and atraumatic.     Mouth/Throat:     Pharynx: No oropharyngeal exudate.  Eyes:     General: No scleral icterus.    Conjunctiva/sclera: Conjunctivae normal.  Cardiovascular:     Rate and Rhythm: Normal rate and regular rhythm.     Pulses: Normal pulses.     Heart sounds: Normal heart sounds.  Pulmonary:     Effort: Pulmonary effort is normal. No respiratory distress.     Breath sounds: Normal breath sounds. No wheezing.  Abdominal:     General: Bowel sounds are normal.     Palpations: Abdomen is soft. There is no mass.     Tenderness: There is abdominal tenderness in the epigastric area and suprapubic area. There is no guarding or rebound. Negative signs include Murphy's sign.     Comments: Tenderness to palpation noted to epigastric and suprapubic region. Negative Murphy signs.  Musculoskeletal:        General: Normal range of motion.     Cervical back: Normal range of motion and neck supple.     Comments: Tenderness to palpation noted to thoracic and lumbar spine.  Patient able to ambulate without assistance or difficulty.  Positive straight leg raise to the left. Strength and sensation intact to bilateral upper and lower extremities.  Skin:    General: Skin is warm and dry.  Neurological:     Mental Status: She is alert.     Cranial Nerves: Cranial nerves 2-12 are intact.     Sensory: Sensation is intact.     Motor: Motor function is intact. No pronator drift.     Comments: Cranial nerves II through XII grossly intact.  Sensation intact.  Negative pronator drift.  Psychiatric:        Behavior: Behavior normal.    ED Results / Procedures / Treatments   Labs (all labs ordered are listed, but only abnormal results are displayed) Labs Reviewed  COMPREHENSIVE METABOLIC PANEL -  Abnormal; Notable for the following components:      Result Value   Creatinine, Ser 1.09 (*)    All other components within normal limits  URINALYSIS, ROUTINE W REFLEX MICROSCOPIC - Abnormal; Notable for the following components:   APPearance HAZY (*)    Ketones, ur 5 (*)    Protein, ur 30 (*)    Leukocytes,Ua TRACE (*)    All other components within normal limits  WET PREP, GENITAL  CBC WITH DIFFERENTIAL/PLATELET  LIPASE, BLOOD  RPR  HIV ANTIBODY (ROUTINE TESTING W REFLEX)  I-STAT  BETA HCG BLOOD, ED ([facility], [facility], [facility] ONLY)  GC/CHLAMYDIA PROBE AMP ([facility]) NOT AT [facility]    EKG None  Radiology DG Thoracic Spine 2 View  Result Date: 06/03/2021 CLINICAL DATA:  Back pain EXAM: THORACIC SPINE 2 VIEWS COMPARISON:  None. FINDINGS: There is no evidence of thoracic spine fracture. Alignment is normal. No other significant bone abnormalities are identified. IMPRESSION: Negative. Electronically Signed   By: Anner Crete M.D.   On: 06/03/2021 23:07   CT ABDOMEN PELVIS W CONTRAST  Result Date: 06/03/2021 CLINICAL DATA:  Abdominal pain EXAM: CT ABDOMEN AND PELVIS WITH CONTRAST TECHNIQUE: Multidetector CT imaging of the abdomen and pelvis was performed using the standard protocol following bolus administration of intravenous contrast. RADIATION DOSE REDUCTION: This exam was performed according to the departmental dose-optimization program which includes automated exposure control, adjustment of the mA and/or kV according to patient size and/or use of iterative reconstruction technique. CONTRAST:  137mL OMNIPAQUE IOHEXOL 300 MG/ML  SOLN COMPARISON:  None. FINDINGS: Lower Chest: Unchanged 3 mm right lower lobe pulmonary nodule. No follow-up needed. Hepatobiliary: Normal hepatic contours. No intra- or extrahepatic biliary dilatation. The gallbladder is normal. Pancreas: Normal pancreas. No ductal dilatation or peripancreatic fluid collection. Spleen: Normal. Adrenals/Urinary Tract: The adrenal glands are  normal. No hydronephrosis, nephroureterolithiasis or solid renal mass. The urinary bladder is normal for degree of distention Stomach/Bowel: There is no hiatal hernia. Normal duodenal course and caliber. No small bowel dilatation or inflammation. No focal colonic abnormality. Normal appendix. Vascular/Lymphatic: Normal course and caliber of the major abdominal vessels. No abdominal or pelvic lymphadenopathy. Reproductive: Normal retroflexed uterus.  No adnexal mass. Other: None. Musculoskeletal: No bony spinal canal stenosis or focal osseous abnormality. IMPRESSION: No acute abnormality of the abdomen or pelvis. Electronically Signed   By: Ulyses Jarred M.D.   On: 06/03/2021 23:37   CT L-SPINE NO CHARGE  Result Date: 06/03/2021 CLINICAL DATA:  Back pain radiating to the left leg EXAM: CT LUMBAR SPINE WITHOUT CONTRAST TECHNIQUE: Multidetector CT imaging of the lumbar spine was performed without intravenous contrast administration. Multiplanar CT image reconstructions were also generated. RADIATION DOSE REDUCTION: This exam was performed according to the departmental dose-optimization program which includes automated exposure control, adjustment of the mA and/or kV according to patient size and/or use of iterative reconstruction technique. COMPARISON:  None. FINDINGS: Segmentation: 5 lumbar type vertebrae. Alignment: Normal. Vertebrae: No acute fracture or focal pathologic process. Paraspinal and other soft tissues: Negative. Disc levels: No spinal canal or neural foraminal stenosis. IMPRESSION: No acute fracture or static subluxation of the lumbar spine. Electronically Signed   By: Ulyses Jarred M.D.   On: 06/03/2021 23:28    Procedures Procedures    Medications Ordered in ED Medications  iohexol (OMNIPAQUE) 300 MG/ML solution 100 mL (100 mLs Intravenous Contrast Given 06/03/21 2301)    ED Course/ Medical Decision Making/ A&P Clinical Course as of 06/04/21 0007  Sun Jun 03, 2021  2353 Discussed lab  and imaging findings with patient at bedside.  Discussed discharge treatment plan.  Patient agreeable at this time.  Patient appears safe for discharge. [SB]    Clinical Course User Index [SB] Alanis Clift A, PA-C                           Medical Decision Making Amount and/or Complexity of Data Reviewed Labs: ordered. Radiology: ordered.  Risk Prescription drug management.   Patient presents to the emergency department with concerns  for an back pain radiating down left leg x1 week.  Denies injury, trauma or heavy lifting.  Vital signs stable, patient afebrile, not tachycardic or hypoxic.  On exam patient with tenderness to palpation noted to thoracic and lumbar spine without overlying skin changes.  No focal neurological deficits.  Able to ambulate without assistance or difficulty.   Patient also voices concern for possible pregnancy.  Patient has a history of PCOS and was started on progesterone on 05/11/2021 that she took for 10 days and started having menstrual cycle on 05/21/2021.  Patient notes she would also like a full STI work-up at this time.  Labs:  I ordered, and personally interpreted labs.  The pertinent results include:  Lipase unremarkable at 29. I-STAT beta-hCG < 5.0 and negative. Wet prep unremarkable. CMP overall unremarkable, slightly increasing creatinine at 1.09 however BUN stable at 11. CBC unremarkable. HIV antibody, RPR, GC chlamydia ordered with results pending at time of discharge.   Imaging: I ordered imaging studies including thoracic x-ray, CT lumbar spine, CT abdomen pelvis I independently visualized and interpreted imaging which showed: Thoracic spine: No acute fracture or dislocation. CT lumbar spine: No acute fracture or dislocation. CT abdomen pelvis: No acute intra-abdominal findings. I agree with the radiologist interpretation   Disposition: Patient presentation suspicious for sciatica to the left side.  Doubt fracture, dislocation, herniation  at this time.  Negative pregnancy test in the ED.  Doubt pregnancy at this time.  STI work-up pending at time of discharge.  After consideration of the diagnostic results and the patients response to treatment, I feel that the patient would benefit from discharge home with Robaxin.  Discussed with patient not to drive or operate heavy machinery with this medication.  Discussed with patient importance of keeping OB/GYN appointment on 06/11/2021 for follow-up regarding desired pregnancy.  Supportive care measures and strict return precautions discussed with patient at bedside. Pt acknowledges and verbalizes understanding. Pt appears safe for discharge. Follow up as indicated in discharge paperwork.    This chart was dictated using voice recognition software, Dragon. Despite the best efforts of this provider to proofread and correct errors, errors may still occur which can change documentation meaning.  Final Clinical Impression(s) / ED Diagnoses Final diagnoses:  Strain of lumbar region, initial encounter    Rx / DC Orders ED Discharge Orders          Ordered    methocarbamol (ROBAXIN) 500 MG tablet  2 times daily        06/03/21 2354              Bayla Mcgovern A, PA-C 06/04/21 0007    Gareth Morgan, MD 06/04/21 [phone]

## 2021-06-03 NOTE — Discharge Instructions (Addendum)
Was a pleasure taking care of you today!  Your labs and imaging studies were unremarkable.  You will be sent a prescription for Robaxin, take as directed.  Do not drive or operate heavy machinery with this medication.  You may take over-the-counter 500 mg Tylenol every 6 hours or 600 mg ibuprofen every 6 hours as needed for pain for no more than 7 days.  Maintain your follow-up appointment with your OB GYN specialist.  Return to the emergency department if you are experiencing increasing/worsening back pain, fever, worsening symptoms.

## 2021-06-03 NOTE — ED Triage Notes (Signed)
Patient c/o back pain radiating down L leg x1 week. Denies injury.

## 2021-06-04 LAB — GC/CHLAMYDIA PROBE AMP (~~LOC~~) NOT AT ARMC
Chlamydia: NEGATIVE
Comment: NEGATIVE
Comment: NORMAL
Neisseria Gonorrhea: NEGATIVE

## 2021-06-04 LAB — HIV ANTIBODY (ROUTINE TESTING W REFLEX): HIV Screen 4th Generation wRfx: NONREACTIVE

## 2021-06-04 LAB — RPR: RPR Ser Ql: NONREACTIVE

## 2021-07-16 ENCOUNTER — Emergency Department (HOSPITAL_BASED_OUTPATIENT_CLINIC_OR_DEPARTMENT_OTHER)
Admission: EM | Admit: 2021-07-16 | Discharge: 2021-07-16 | Disposition: A | Discharge status: Home / Self Care | Payer: Medicaid Other | Attending: Emergency Medicine | Admitting: Emergency Medicine

## 2021-07-16 ENCOUNTER — Encounter (HOSPITAL_BASED_OUTPATIENT_CLINIC_OR_DEPARTMENT_OTHER): Payer: Self-pay

## 2021-07-16 ENCOUNTER — Other Ambulatory Visit: Payer: Self-pay

## 2021-07-16 DIAGNOSIS — R509 Fever, unspecified: Secondary | ICD-10-CM | POA: Diagnosis present

## 2021-07-16 DIAGNOSIS — R519 Headache, unspecified: Secondary | ICD-10-CM | POA: Insufficient documentation

## 2021-07-16 DIAGNOSIS — M7918 Myalgia, other site: Secondary | ICD-10-CM | POA: Insufficient documentation

## 2021-07-16 DIAGNOSIS — R000 Tachycardia, unspecified: Secondary | ICD-10-CM | POA: Insufficient documentation

## 2021-07-16 DIAGNOSIS — J069 Acute upper respiratory infection, unspecified: Secondary | ICD-10-CM

## 2021-07-16 DIAGNOSIS — Z20822 Contact with and (suspected) exposure to covid-19: Secondary | ICD-10-CM | POA: Diagnosis not present

## 2021-07-16 DIAGNOSIS — Z79899 Other long term (current) drug therapy: Secondary | ICD-10-CM | POA: Insufficient documentation

## 2021-07-16 DIAGNOSIS — R059 Cough, unspecified: Secondary | ICD-10-CM | POA: Diagnosis not present

## 2021-07-16 LAB — RESP PANEL BY RT-PCR (FLU A&B, COVID) ARPGX2
Influenza A by PCR: NEGATIVE
Influenza B by PCR: NEGATIVE
SARS Coronavirus 2 by RT PCR: NEGATIVE

## 2021-07-16 MED ORDER — ALBUTEROL SULFATE HFA 108 (90 BASE) MCG/ACT IN AERS
2.0000 | INHALATION_SPRAY | Freq: Once | RESPIRATORY_TRACT | Status: AC
  Administered 2021-07-16: 2 via RESPIRATORY_TRACT
  Filled 2021-07-16: qty 6.7

## 2021-07-16 MED ORDER — DEXAMETHASONE SODIUM PHOSPHATE 10 MG/ML IJ SOLN
10.0000 mg | Freq: Once | INTRAMUSCULAR | Status: AC
  Administered 2021-07-16: 10 mg via INTRAMUSCULAR
  Filled 2021-07-16: qty 1

## 2021-07-16 MED ORDER — BENZONATATE 100 MG PO CAPS: 100.0000 mg | ORAL_CAPSULE | Freq: Three times a day (TID) | ORAL | 0 refills | Status: DC

## 2021-07-16 MED ORDER — BENZONATATE 100 MG PO CAPS
200.0000 mg | ORAL_CAPSULE | Freq: Once | ORAL | Status: AC
  Administered 2021-07-16: 200 mg via ORAL
  Filled 2021-07-16: qty 2

## 2021-07-16 NOTE — ED Provider Notes (Signed)
?[facility] EMERGENCY DEPT ?Provider Note ? ? ?CSN: [id number] ?Arrival date & time: 07/16/21  2147 ? ?  ? ?History ? ?Chief Complaint  ?Patient presents with  ? Fever  ? ? ?Elizabeth Lam is a 21 y.o. female. ? ?The history is provided by the patient.  ?Fever ? ?Patient with history of asthma presents today with cough, body aches, fevers, headache congestion x3 days.  Nobody around her is sick, she is COVID vaccinated and flu vaccinated.  She uses her albuterol inhaler daily but does not endorse needing it more than often.  No specific chest pain, states the headache is worse whenever she coughs.  She has not tried anything over-the-counter for her symptoms yet. ? ?Home Medications ?Prior to Admission medications   ?Medication Sig Start Date End Date Taking? Authorizing Provider  ?benzonatate (TESSALON) 200 MG capsule Take 1 capsule (200 mg total) by mouth 3 (three) times daily as needed for cough. 02/28/21   Jacqlyn Larsen, PA-C  ?cetirizine (ZYRTEC) 10 MG tablet Take 1 tablet (10 mg total) by mouth daily as needed for allergies or rhinitis. 09/28/20   Kathie Dike, MD  ?EPINEPHrine 0.3 mg/0.3 mL IJ SOAJ injection Inject 0.3 mg into the muscle once as needed for anaphylaxis. 02/12/19   [provider]  ?methocarbamol (ROBAXIN) 500 MG tablet Take 1 tablet (500 mg total) by mouth 2 (two) times daily. 06/03/21   Blue, Soijett A, PA-C  ?metroNIDAZOLE (FLAGYL) 500 MG tablet Take 1 tablet (500 mg total) by mouth 2 (two) times daily. 05/06/21   Petrucelli, Samantha R, PA-C  ?naproxen (NAPROSYN) 500 MG tablet Take 1 tablet (500 mg total) by mouth 2 (two) times daily as needed for moderate pain. 05/06/21   Petrucelli, Samantha R, PA-C  ?ondansetron (ZOFRAN-ODT) 4 MG disintegrating tablet Take 1 tablet (4 mg total) by mouth every 8 (eight) hours as needed for nausea or vomiting. 05/06/21   Petrucelli, Glynda Jaeger, PA-C  ?PROAIR HFA 108 (90 Base) MCG/ACT inhaler Inhale 2 puffs into the lungs every 6 (six)  hours as needed for wheezing or shortness of breath.    [provider]  ?triamcinolone cream (KENALOG) 0.1 % Apply 1 application topically 3 (three) times daily.    [provider]  ?ipratropium (ATROVENT) 0.06 % nasal spray Place 2 sprays into both nostrils 4 (four) times daily. 02/21/19 06/02/19  Ok Edwards, PA-C  ?metoCLOPramide (REGLAN) 10 MG tablet Take 1 tablet (10 mg total) by mouth every 6 (six) hours. 02/21/19 06/02/19  Ok Edwards, PA-C  ?prazosin (MINIPRESS) 2 MG capsule Take 1 capsule (2 mg total) by mouth at bedtime. 11/11/18 06/02/19  Toney Rakes, MD  ?Aldona Lento 0.1-20 MG-MCG tablet TAKE ONE TABLET BY MOUTH DAILY FOR 28 DAYS. 12/28/18 06/02/19  [provider]  ?   ? ?Allergies    ?Bee venom, Fire ant, Other, Red dye, Wasp venom, and [place] [crisaborole]   ? ?Review of Systems   ?Review of Systems  ?Constitutional:  Positive for fever.  ? ?Physical Exam ?Updated Vital Signs ?BP 105/78 (BP Location: Right Arm)   Pulse (!) 108   Temp 98.6 ?F (37 ?C) (Oral)   Resp 16   Ht '5\' 6"'$  (1.676 m)   Wt 99.8 kg   SpO2 100%   BMI 35.51 kg/m?  ?Physical Exam ?Vitals and nursing note reviewed. Exam conducted with a chaperone present.  ?Constitutional:   ?   Appearance: Normal appearance.  ?HENT:  ?   Head: Normocephalic and  atraumatic.  ?Eyes:  ?   General: No scleral icterus.    ?   Right eye: No discharge.     ?   Left eye: No discharge.  ?   Extraocular Movements: Extraocular movements intact.  ?   Pupils: Pupils are equal, round, and reactive to light.  ?Cardiovascular:  ?   Rate and Rhythm: Regular rhythm. Tachycardia present.  ?   Pulses: Normal pulses.  ?   Heart sounds: Normal heart sounds. No murmur heard. ?  No friction rub. No gallop.  ?Pulmonary:  ?   Effort: Pulmonary effort is normal. No respiratory distress.  ?   Breath sounds: Wheezing present.  ?Abdominal:  ?   General: Abdomen is flat. Bowel sounds are normal. There is no distension.  ?   Palpations: Abdomen is soft.  ?    Tenderness: There is no abdominal tenderness.  ?Skin: ?   General: Skin is warm and dry.  ?   Coloration: Skin is not jaundiced.  ?Neurological:  ?   Mental Status: She is alert. Mental status is at baseline.  ?   Coordination: Coordination normal.  ? ? ?ED Results / Procedures / Treatments   ?Labs ?(all labs ordered are listed, but only abnormal results are displayed) ?Labs Reviewed  ?RESP PANEL BY RT-PCR (FLU A&B, COVID) [id number]  ? ? ?EKG ?None ? ?Radiology ?No results found. ? ?Procedures ?Procedures  ? ? ?Medications Ordered in ED ?Medications  ?dexamethasone (DECADRON) injection 10 mg (has no administration in time range)  ?albuterol (VENTOLIN HFA) 108 (90 Base) MCG/ACT inhaler 2 puff (has no administration in time range)  ?benzonatate (TESSALON) capsule 200 mg (has no administration in time range)  ? ? ?ED Course/ Medical Decision Making/ A&P ?  ?                        ?Medical Decision Making ?Risk ?Prescription drug management. ? ? ?Patient presents with viral symptoms.  She is slightly tachycardic and there is wheeze noted on expiration.  Will refill albuterol inhaler and a shot of Decadron here.  Cough medicine also prescribed.  Given acuity of symptoms I do not think this would be a bacterial pneumonia, she is not having any specific chest pain so although I did consider cardiac etiology think that is less likely.  ? ?I ordered albuterol refill given patient is out of medicine, also ordered Tessalon Perles and a shot of Decadron here in the ED.  Will discharge with Tessalon Perles for cough and work note, patient can check MyChart for the result of the [place] flu test work note was provided.  We discussed tricked return precautions, she was discharged in stable condition. ? ? ? ? ? ? ? ?Final Clinical Impression(s) / ED Diagnoses ?Final diagnoses:  ?None  ? ? ?Rx / DC Orders ?ED Discharge Orders   ? ? None  ? ?  ? ? ?  ?Sherrill Raring, PA-C ?07/16/21 2225 ? ?  ?Luna Fuse, MD ?07/16/21 2322 ? ?

## 2021-07-16 NOTE — Discharge Instructions (Addendum)
Use the albuterol inhaler as needed.  You were given a steroid here in the ED that should stay in your system for 3 days and hopefully help improve your symptoms.  Take Tylenol and Motrin at home for the fever.  You can also take the cough medicine every 8 hours as needed to help improve the cough.  Check MyChart for the result of the COVID flu test, stay out of work for the next 5 days if positive or if not feeling better.  Return to the ED if you start feeling short of breath, having chest pain, having worsening symptoms ?

## 2021-07-16 NOTE — ED Triage Notes (Signed)
Patient here POV from Home. ? ?Endorses Symptoms such as Fever, Nausea, Productive Cough, Headache, Body Aches. ? ?No Diarrhea.  ? ?NAD Noted during Triage. A&Ox4. GCS 15. Ambulatory. ?

## 2021-07-23 ENCOUNTER — Ambulatory Visit
Admission: EM | Admit: 2021-07-23 | Discharge: 2021-07-23 | Disposition: A | Discharge status: Home / Self Care | Payer: Medicaid Other

## 2021-07-23 ENCOUNTER — Ambulatory Visit
Admission: EM | Admit: 2021-07-23 | Discharge: 2021-07-23 | Disposition: A | Discharge status: Home / Self Care | Payer: Medicaid Other | Attending: Internal Medicine | Admitting: Internal Medicine

## 2021-07-23 DIAGNOSIS — D369 Benign neoplasm, unspecified site: Secondary | ICD-10-CM | POA: Diagnosis not present

## 2021-07-23 DIAGNOSIS — Z3202 Encounter for pregnancy test, result negative: Secondary | ICD-10-CM

## 2021-07-23 LAB — POCT URINE PREGNANCY: Preg Test, Ur: NEGATIVE

## 2021-07-23 NOTE — ED Notes (Signed)
Attempted to contact patient to come in and be seen, phone is going to vm.  [place].  Patient is not in the lobby either.   ?

## 2021-07-23 NOTE — ED Triage Notes (Addendum)
Patient presents to Urgent Care with concern of potential pregnancy since 4/15 pos urine test just wants confermation.  ? ?Pt reports lump on L hand that has been present for 2 years that is now painful would like referal  ?

## 2021-07-23 NOTE — ED Provider Notes (Signed)
?[place] ? ? ? ?CSN: [id number] ?Arrival date & time: 07/23/21  1617 ? ? ?  ? ?History   ?Chief Complaint ?Chief Complaint  ?Patient presents with  ? Possible Pregnancy  ? ? ?HPI ?Elizabeth Lam is a 21 y.o. female.  ? ?Patient presents today requesting pregnancy test.  Patient reports that she took a pregnancy test on 07/21/2021 at home that was positive.  Denies missed menstrual cycle but patient reports that she was prompted to take a pregnancy test when her menstrual cycle at the beginning of the month was abnormal.  She reports that she typically bleeds approximately 5 to 7 days but she had a very light menstrual cycle that was only 3 days.  She does report that she has irregular menstrual cycles at baseline given that she has PCOS and has had to take medication to initiate her periods in the past.  She did take this medication about 10 days prior to most recent menstrual cycle.  Denies any associated nausea, vomiting, breast tenderness, abdominal pain, urinary symptoms.  Patient also has lump on left hand that has been present for approximately 2 years.  She reports that it has not been bothersome until a few days ago when it became painful.  Denies fevers, body aches, chills or any change in appearance of lump. ? ? ?Possible Pregnancy ? ? ?Past Medical History:  ?Diagnosis Date  ? Anxiety   ? COVID-19 virus infection 04/2020  ? Depression   ? Difficulty sleeping   ? Morbid obesity ([place])   ? OCD (obsessive compulsive disorder)   ? PCOS (polycystic ovarian syndrome)   ? Psychogenic nonepileptic seizure   ? PTSD (post-traumatic stress disorder)   ? Vaso vagal episode 2021  ? ? ?Patient Active Problem List  ? Diagnosis Date Noted  ? Morbid obesity ([place])   ? COVID-19 virus infection 04/2020  ? 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 Vaso vagal episode 2021  ? Insomnia   ? Vasovagal episode 10/30/2018  ? Seizure-like activity ([place]) 10/30/2018  ? Anxiety state 10/30/2018  ? 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 Chronic post-traumatic stress disorder (PTSD) 06/02/2018  ? Suicide ideation 06/02/2018  ? Self-injurious behavior 06/02/2018  ? ? ?Past Surgical History:  ?Procedure Laterality Date  ? DENTAL SURGERY    ? TONSILLECTOMY    ? ? ?OB History   ? ? Gravida  ?1  ? Para  ?   ? Term  ?   ? Preterm  ?   ? AB  ?   ? Living  ?   ?  ? ? SAB  ?   ? IAB  ?   ? Ectopic  ?   ? Multiple  ?   ? Live Births  ?   ?   ?  ?  ? ? ? ?Home Medications   ? ?Prior to Admission medications   ?Medication Sig Start Date End Date Taking? Authorizing Provider  ?benzonatate (TESSALON) 100 MG capsule Take 1 capsule (100 mg total) by mouth every 8 (eight) hours. ?Patient not taking: Reported on 07/23/2021 07/16/21   Sherrill Raring, PA-C  ?cetirizine (ZYRTEC) 10 MG tablet Take 1 tablet (10 mg total) by mouth daily as needed for allergies or rhinitis. ?Patient not taking: Reported on 07/23/2021 09/28/20   Kathie Dike, MD  ?EPINEPHrine 0.3 mg/0.3 mL IJ SOAJ injection Inject 0.3 mg into the muscle once as needed for anaphylaxis. 02/12/19   [provider]  ?methocarbamol (ROBAXIN) 500 MG tablet Take 1 tablet (500 mg total) by mouth 2 (two) times daily. ?Patient not taking: Reported on 07/23/2021 06/03/21   Blue, Soijett A, PA-C  ?metroNIDAZOLE (FLAGYL) 500 MG tablet Take 1 tablet (500 mg total) by mouth 2 (two) times daily. ?Patient not taking: Reported on 07/23/2021 05/06/21   Petrucelli, Aldona Bar R, PA-C  ?naproxen (NAPROSYN) 500 MG tablet Take 1 tablet (500 mg total) by mouth 2 (two) times daily as needed for moderate pain. ?Patient not taking: Reported on 07/23/2021 05/06/21   Petrucelli, Aldona Bar R, PA-C  ?ondansetron (ZOFRAN-ODT) 4 MG disintegrating tablet Take 1 tablet (4 mg total) by mouth every 8 (eight) hours as needed for nausea or vomiting. ?Patient not taking: Reported on 07/23/2021 05/06/21   Petrucelli, Glynda Jaeger, PA-C  ?PROAIR HFA 108 (90 Base) MCG/ACT inhaler Inhale 2  puffs into the lungs every 6 (six) hours as needed for wheezing or shortness of breath.    [provider]  ?triamcinolone cream (KENALOG) 0.1 % Apply 1 application topically 3 (three) times daily. ?Patient not taking: Reported on 07/23/2021    [provider]  ?ipratropium (ATROVENT) 0.06 % nasal spray Place 2 sprays into both nostrils 4 (four) times daily. 02/21/19 06/02/19  Ok Edwards, PA-C  ?metoCLOPramide (REGLAN) 10 MG tablet Take 1 tablet (10 mg total) by mouth every 6 (six) hours. 02/21/19 06/02/19  Ok Edwards, PA-C  ?prazosin (MINIPRESS) 2 MG capsule Take 1 capsule (2 mg total) by mouth at bedtime. 11/11/18 06/02/19  Toney Rakes, MD  ?Aldona Lento 0.1-20 MG-MCG tablet TAKE ONE TABLET BY MOUTH DAILY FOR 28 DAYS. 12/28/18 06/02/19  [provider]  ? ? ?Family History ?Family History  ?Problem Relation Age of Onset  ? Arrhythmia Mother   ?     [place], has ICD  ? Healthy Father   ? ? ?Social History ?Social History  ? ?Tobacco Use  ? Smoking status: Never  ? Smokeless tobacco: Never  ?Vaping Use  ? Vaping Use: Some days  ? Substances: Nicotine  ?Substance Use Topics  ? Alcohol use: Never  ? Drug use: Yes  ?  Frequency: 3.0 times per week  ?  Types: Marijuana  ? ? ? ?Allergies   ?Bee venom, Fire ant, Other, Red dye, Wasp venom, and [place] [crisaborole] ? ? ?Review of Systems ?Review of Systems ?Per HPI ? ?Physical Exam ?Triage Vital Signs ?ED Triage Vitals  ?Enc Vitals Group  ?   BP 07/23/21 1735 106/72  ?   Pulse Rate 07/23/21 1735 66  ?   Resp 07/23/21 1735 18  ?   Temp 07/23/21 1735 98.2 ?F (36.8 ?C)  ?   Temp Source 07/23/21 1735 Oral  ?   SpO2 07/23/21 1735 93 %  ?   Weight --   ?   Height --   ?   Head Circumference --   ?   Peak Flow --   ?   Pain Score 07/23/21 1731 0  ?   Pain Loc --   ?   Pain Edu? --   ?   Excl. in [place]? --   ? ?No data found. ? ?Updated Vital Signs ?BP 106/72 (BP Location: Left Arm)   Pulse 66   Temp 98.2 ?F (36.8 ?C) (Oral)   Resp 18   LMP 06/12/2021 (Exact Date)    SpO2 93%  ? ?Visual Acuity ?Right Eye Distance:   ?Left Eye Distance:   ?Bilateral Distance:   ? ?  Right Eye Near:   ?Left Eye Near:    ?Bilateral Near:    ? ?Physical Exam ?Constitutional:   ?   General: She is not in acute distress. ?   Appearance: Normal appearance. She is not toxic-appearing or diaphoretic.  ?HENT:  ?   Head: Normocephalic and atraumatic.  ?Eyes:  ?   Extraocular Movements: Extraocular movements intact.  ?   Conjunctiva/sclera: Conjunctivae normal.  ?Cardiovascular:  ?   Rate and Rhythm: Normal rate and regular rhythm.  ?   Pulses: Normal pulses.  ?   Heart sounds: Normal heart sounds.  ?Pulmonary:  ?   Effort: Pulmonary effort is normal. No respiratory distress.  ?   Breath sounds: Normal breath sounds.  ?Skin: ?   Comments: Nonmobile flesh-colored cystlike lesion present to dorsal surface of left hand directly above distal radius.  No obvious discoloration, erythema, purulent drainage.  No pain to palpation.  ?Neurological:  ?   General: No focal deficit present.  ?   Mental Status: She is alert and oriented to person, place, and time. Mental status is at baseline.  ?Psychiatric:     ?   Mood and Affect: Mood normal.     ?   Behavior: Behavior normal.     ?   Thought Content: Thought content normal.     ?   Judgment: Judgment normal.  ? ? ? ?UC Treatments / Results  ?Labs ?(all labs ordered are listed, but only abnormal results are displayed) ?Labs Reviewed  ?HCG, QUANTITATIVE, PREGNANCY  ?POCT URINE PREGNANCY  ? ? ?EKG ? ? ?Radiology ?No results found. ? ?Procedures ?Procedures (including critical care time) ? ?Medications Ordered in UC ?Medications - No data to display ? ?Initial Impression / Assessment and Plan / UC Course  ?I have reviewed the triage vital signs and the nursing notes. ? ?Pertinent labs & imaging results that were available during my care of the patient were reviewed by me and considered in my medical decision making (see chart for details). ? ?  ? ?Urine pregnancy test  was negative.  Quantitative hCG pending.  Advised patient to follow-up with her OB/GYN for further evaluation and management of irregular periods. ? ?Dermoid cyst present to left hand.  Patient provided with con

## 2021-07-23 NOTE — Discharge Instructions (Signed)
Urine pregnancy test was negative.  Blood work for hCG pregnancy test is pending.  We will call you if it is positive.  Please follow-up with general surgery at provided contact information for further evaluation and management of cyst to left hand. ?

## 2021-07-23 NOTE — ED Triage Notes (Deleted)
Pt reports lump on L hand that has been present for 2 years that is now painful would like referal  ? ?

## 2021-07-26 LAB — SPECIMEN STATUS REPORT

## 2021-07-26 LAB — BETA HCG QUANT (REF LAB): hCG Quant: <1 m[IU]/mL

## 2021-09-28 ENCOUNTER — Other Ambulatory Visit: Payer: Self-pay

## 2021-09-28 ENCOUNTER — Emergency Department (HOSPITAL_BASED_OUTPATIENT_CLINIC_OR_DEPARTMENT_OTHER): Payer: Medicaid Other

## 2021-09-28 ENCOUNTER — Encounter (HOSPITAL_BASED_OUTPATIENT_CLINIC_OR_DEPARTMENT_OTHER): Payer: Self-pay

## 2021-09-28 ENCOUNTER — Emergency Department (HOSPITAL_BASED_OUTPATIENT_CLINIC_OR_DEPARTMENT_OTHER)
Admission: EM | Admit: 2021-09-28 | Discharge: 2021-09-28 | Disposition: A | Discharge status: Home / Self Care | Payer: Medicaid Other | Attending: Emergency Medicine | Admitting: Emergency Medicine

## 2021-09-28 DIAGNOSIS — W228XXA Striking against or struck by other objects, initial encounter: Secondary | ICD-10-CM | POA: Diagnosis not present

## 2021-09-28 DIAGNOSIS — S6991XA Unspecified injury of right wrist, hand and finger(s), initial encounter: Secondary | ICD-10-CM | POA: Diagnosis present

## 2021-09-28 NOTE — ED Provider Notes (Signed)
[facility] EMERGENCY DEPT Provider Note   CSN: [id number] Arrival date & time: 09/28/21  1651     History  Chief Complaint  Patient presents with   Hand Injury    Elizabeth Lam is a 21 y.o. female with medical history of anxiety, depression, OCD, PCOS, PTSD. Patient presents to ED for concern of hand injury. Patient states last night at 7:30PM she struck her hand on glass window out of anger. Patient has had persistent pain since this time.  Patient denies taking medications to control symptoms prior to arrival.  Patient denies any numbness or tingling in the hand.   Hand Injury      Home Medications Prior to Admission medications   Medication Sig Start Date End Date Taking? Authorizing Provider  benzonatate (TESSALON) 100 MG capsule Take 1 capsule (100 mg total) by mouth every 8 (eight) hours. Patient not taking: Reported on 07/23/2021 07/16/21   Sherrill Raring, PA-C  cetirizine (ZYRTEC) 10 MG tablet Take 1 tablet (10 mg total) by mouth daily as needed for allergies or rhinitis. Patient not taking: Reported on 07/23/2021 09/28/20   Kathie Dike, MD  EPINEPHrine 0.3 mg/0.3 mL IJ SOAJ injection Inject 0.3 mg into the muscle once as needed for anaphylaxis. 02/12/19   [provider]  methocarbamol (ROBAXIN) 500 MG tablet Take 1 tablet (500 mg total) by mouth 2 (two) times daily. Patient not taking: Reported on 07/23/2021 06/03/21   Blue, Soijett A, PA-C  metroNIDAZOLE (FLAGYL) 500 MG tablet Take 1 tablet (500 mg total) by mouth 2 (two) times daily. Patient not taking: Reported on 07/23/2021 05/06/21   Petrucelli, Aldona Bar R, PA-C  naproxen (NAPROSYN) 500 MG tablet Take 1 tablet (500 mg total) by mouth 2 (two) times daily as needed for moderate pain. Patient not taking: Reported on 07/23/2021 05/06/21   Petrucelli, Samantha R, PA-C  ondansetron (ZOFRAN-ODT) 4 MG disintegrating tablet Take 1 tablet (4 mg total) by mouth every 8 (eight) hours as needed for nausea or  vomiting. Patient not taking: Reported on 07/23/2021 05/06/21   Petrucelli, Glynda Jaeger, PA-C  PROAIR HFA 108 (90 Base) MCG/ACT inhaler Inhale 2 puffs into the lungs every 6 (six) hours as needed for wheezing or shortness of breath.    [provider]  triamcinolone cream (KENALOG) 0.1 % Apply 1 application topically 3 (three) times daily. Patient not taking: Reported on 07/23/2021    [provider]  ipratropium (ATROVENT) 0.06 % nasal spray Place 2 sprays into both nostrils 4 (four) times daily. 02/21/19 06/02/19  Ok Edwards, PA-C  metoCLOPramide (REGLAN) 10 MG tablet Take 1 tablet (10 mg total) by mouth every 6 (six) hours. 02/21/19 06/02/19  Tasia Catchings, Amy V, PA-C  prazosin (MINIPRESS) 2 MG capsule Take 1 capsule (2 mg total) by mouth at bedtime. 11/11/18 06/02/19  Toney Rakes, MD  SRONYX 0.1-20 MG-MCG tablet TAKE ONE TABLET BY MOUTH DAILY FOR 28 DAYS. 12/28/18 06/02/19  [provider]      Allergies    Bee venom, Fire ant, Other, Red dye, Wasp venom, and Eucrisa [crisaborole]    Review of Systems   Review of Systems  Musculoskeletal:  Positive for myalgias.  All other systems reviewed and are negative.   Physical Exam Updated Vital Signs BP 119/84   Pulse 90   Temp 98.1 F (36.7 C)   Resp 17   Ht '5\' 6"'$  (1.676 m)   Wt 95.3 kg   SpO2 99%   BMI 33.89 kg/m  Physical  Exam Vitals and nursing note reviewed.  Constitutional:      General: She is not in acute distress.    Appearance: Normal appearance. She is not ill-appearing, toxic-appearing or diaphoretic.  HENT:     Head: Normocephalic and atraumatic.     Nose: Nose normal. No congestion.     Mouth/Throat:     Mouth: Mucous membranes are moist.     Pharynx: Oropharynx is clear.  Eyes:     Extraocular Movements: Extraocular movements intact.     Conjunctiva/sclera: Conjunctivae normal.     Pupils: Pupils are equal, round, and reactive to light.  Cardiovascular:     Rate and Rhythm: Normal rate and  regular rhythm.  Pulmonary:     Effort: Pulmonary effort is normal.     Breath sounds: Normal breath sounds. No wheezing.  Abdominal:     General: Abdomen is flat. Bowel sounds are normal.     Palpations: Abdomen is soft.  Musculoskeletal:     Right hand: Tenderness present. No swelling, deformity or lacerations. Normal range of motion.     Left hand: Normal.     Cervical back: Normal range of motion and neck supple. No tenderness.     Comments: Patient has diffuse tenderness across tops of her right knuckles.  There is no deformity, overlying skin change, laceration.  Skin:    General: Skin is warm and dry.     Capillary Refill: Capillary refill takes less than 2 seconds.  Neurological:     Mental Status: She is alert and oriented to person, place, and time.     ED Results / Procedures / Treatments   Labs (all labs ordered are listed, but only abnormal results are displayed) Labs Reviewed - No data to display  EKG None  Radiology DG Hand Complete Right  Result Date: 09/28/2021 CLINICAL DATA:  Trauma punched a window EXAM: RIGHT HAND - COMPLETE 3+ VIEW COMPARISON:  02/28/2020 FINDINGS: There is no evidence of fracture or dislocation. There is no evidence of arthropathy or other focal bone abnormality. Soft tissues are unremarkable. IMPRESSION: Negative. Electronically Signed   By: Donavan Foil M.D.   On: 09/28/2021 17:29    Procedures Procedures   Medications Ordered in ED Medications - No data to display  ED Course/ Medical Decision Making/ A&P                           Medical Decision Making Amount and/or Complexity of Data Reviewed Radiology: ordered.   21 year old female presents to ED for evaluation.  Please see HPI for further details.  On examination, patient has no obvious deformity of the right hand.  Patient has 2+ DP pulse in the right hand.  Less than 2-second capillary refill.  Patient neurovascularly intact to the entire hand.  No numbness, tingling.   No laceration.  Plain film imaging of right hand shows no fracture, deformity, dislocation.  The patient be advised to begin taking ibuprofen and icing her hand at home.  Patient encouraged to follow-up with PCP for further management.  Patient given return precautions and voiced understanding.  Patient had all of her questions answered to her satisfaction.  The patient stable at this time for discharge home.   Final Clinical Impression(s) / ED Diagnoses Final diagnoses:  Injury of right hand, initial encounter    Rx / DC Orders ED Discharge Orders     None         Kathleen Likins,  Lesle Reek, PA-C 09/28/21 1749    Luna Fuse, MD 10/06/21 [phone]

## 2021-09-28 NOTE — ED Triage Notes (Signed)
Pt c/o pain and swelling to right hand after hitting her truck window last night.

## 2021-10-30 ENCOUNTER — Encounter (HOSPITAL_BASED_OUTPATIENT_CLINIC_OR_DEPARTMENT_OTHER): Payer: Self-pay | Admitting: Obstetrics and Gynecology

## 2021-10-30 ENCOUNTER — Emergency Department (HOSPITAL_BASED_OUTPATIENT_CLINIC_OR_DEPARTMENT_OTHER)
Admission: EM | Admit: 2021-10-30 | Discharge: 2021-10-30 | Disposition: A | Discharge status: Home / Self Care | Payer: Medicaid Other | Attending: Emergency Medicine | Admitting: Emergency Medicine

## 2021-10-30 ENCOUNTER — Other Ambulatory Visit: Payer: Self-pay

## 2021-10-30 DIAGNOSIS — N730 Acute parametritis and pelvic cellulitis: Secondary | ICD-10-CM | POA: Insufficient documentation

## 2021-10-30 DIAGNOSIS — Z20822 Contact with and (suspected) exposure to covid-19: Secondary | ICD-10-CM | POA: Insufficient documentation

## 2021-10-30 DIAGNOSIS — R0981 Nasal congestion: Secondary | ICD-10-CM | POA: Insufficient documentation

## 2021-10-30 DIAGNOSIS — R1110 Vomiting, unspecified: Secondary | ICD-10-CM | POA: Diagnosis present

## 2021-10-30 LAB — RESP PANEL BY RT-PCR (FLU A&B, COVID) ARPGX2
Influenza A by PCR: NEGATIVE
Influenza B by PCR: NEGATIVE
SARS Coronavirus 2 by RT PCR: NEGATIVE

## 2021-10-30 LAB — URINALYSIS, ROUTINE W REFLEX MICROSCOPIC
Bilirubin Urine: NEGATIVE
Glucose, UA: NEGATIVE mg/dL
Hgb urine dipstick: NEGATIVE
Ketones, ur: NEGATIVE mg/dL
Leukocytes,Ua: NEGATIVE
Nitrite: NEGATIVE
Protein, ur: 30 mg/dL — AB
Specific Gravity, Urine: 1.022 (ref 1.005–1.030)
pH: 7.5 (ref 5.0–8.0)

## 2021-10-30 LAB — HIV ANTIBODY (ROUTINE TESTING W REFLEX): HIV Screen 4th Generation wRfx: NONREACTIVE

## 2021-10-30 LAB — WET PREP, GENITAL
Clue Cells Wet Prep HPF POC: NONE SEEN
Sperm: NONE SEEN
Trich, Wet Prep: NONE SEEN
WBC, Wet Prep HPF POC: <10 (ref <10)
Yeast Wet Prep HPF POC: NONE SEEN

## 2021-10-30 LAB — PREGNANCY, URINE: Preg Test, Ur: NEGATIVE

## 2021-10-30 MED ORDER — DOXYCYCLINE HYCLATE 100 MG PO CAPS: 100.0000 mg | ORAL_CAPSULE | Freq: Two times a day (BID) | ORAL | 0 refills | Status: AC

## 2021-10-30 MED ORDER — METRONIDAZOLE 500 MG PO TABS: 500.0000 mg | ORAL_TABLET | Freq: Two times a day (BID) | ORAL | 0 refills | Status: DC

## 2021-10-30 MED ORDER — STERILE WATER FOR INJECTION IJ SOLN
INTRAMUSCULAR | Status: AC
  Filled 2021-10-30: qty 10

## 2021-10-30 MED ORDER — CEFTRIAXONE SODIUM 500 MG IJ SOLR
500.0000 mg | INTRAMUSCULAR | Status: AC
  Administered 2021-10-30: 500 mg via INTRAMUSCULAR
  Filled 2021-10-30: qty 500

## 2021-10-30 NOTE — ED Notes (Signed)
At bedside with Dr. Philip Aspen for a patient exam as a chaperone.

## 2021-10-30 NOTE — ED Provider Notes (Signed)
[facility] EMERGENCY DEPT Provider Note   CSN: [id number] Arrival date & time: 10/30/21  1122     History  Chief Complaint  Patient presents with  . Emesis    Elizabeth Lam is a 21 y.o. female.  21 year old female who presents with multiple complaints.  Patient states that for the past 3 days she has had runny nose, sore throat, and nausea vomiting and diarrhea.  Says that she has had approximately 5 episodes of nonbloody nonbilious emesis per day.  Also has had 2-3 episodes of loose stool daily.  Says that she is not currently feeling nauseous.  Says that her LMP was from the 8th-10th of June and that since she is late she was concerned that she may be pregnant with her symptoms.  Did take a pregnancy test at home but says that she had difficulty interpreting it so came in for evaluation.  Also states that for the past week she has had pelvic pain that is constant.  Also accompanied by increased urinary frequency and whitish vaginal discharge that is abnormal for her.  Says that she is currently sexually active.  Is requesting STI testing at this time.   Emesis Associated symptoms: chills, cough and diarrhea        Home Medications Prior to Admission medications   Medication Sig Start Date End Date Taking? Authorizing Provider  doxycycline (VIBRAMYCIN) 100 MG capsule Take 1 capsule (100 mg total) by mouth 2 (two) times daily for 14 days. 10/30/21 11/13/21 Yes Fransico Meadow, MD  metroNIDAZOLE (FLAGYL) 500 MG tablet Take 1 tablet (500 mg total) by mouth 2 (two) times daily. 10/30/21  Yes Fransico Meadow, MD  benzonatate (TESSALON) 100 MG capsule Take 1 capsule (100 mg total) by mouth every 8 (eight) hours. Patient not taking: Reported on 07/23/2021 07/16/21   Sherrill Raring, PA-C  cetirizine (ZYRTEC) 10 MG tablet Take 1 tablet (10 mg total) by mouth daily as needed for allergies or rhinitis. Patient not taking: Reported on 07/23/2021 09/28/20   Kathie Dike, MD   EPINEPHrine 0.3 mg/0.3 mL IJ SOAJ injection Inject 0.3 mg into the muscle once as needed for anaphylaxis. 02/12/19   [provider]  methocarbamol (ROBAXIN) 500 MG tablet Take 1 tablet (500 mg total) by mouth 2 (two) times daily. Patient not taking: Reported on 07/23/2021 06/03/21   Blue, Soijett A, PA-C  naproxen (NAPROSYN) 500 MG tablet Take 1 tablet (500 mg total) by mouth 2 (two) times daily as needed for moderate pain. Patient not taking: Reported on 07/23/2021 05/06/21   Petrucelli, Samantha R, PA-C  ondansetron (ZOFRAN-ODT) 4 MG disintegrating tablet Take 1 tablet (4 mg total) by mouth every 8 (eight) hours as needed for nausea or vomiting. Patient not taking: Reported on 07/23/2021 05/06/21   Petrucelli, Glynda Jaeger, PA-C  PROAIR HFA 108 (90 Base) MCG/ACT inhaler Inhale 2 puffs into the lungs every 6 (six) hours as needed for wheezing or shortness of breath.    [provider]  triamcinolone cream (KENALOG) 0.1 % Apply 1 application topically 3 (three) times daily. Patient not taking: Reported on 07/23/2021    [provider]  ipratropium (ATROVENT) 0.06 % nasal spray Place 2 sprays into both nostrils 4 (four) times daily. 02/21/19 06/02/19  Ok Edwards, PA-C  metoCLOPramide (REGLAN) 10 MG tablet Take 1 tablet (10 mg total) by mouth every 6 (six) hours. 02/21/19 06/02/19  Tasia Catchings, Amy V, PA-C  prazosin (MINIPRESS) 2 MG capsule Take 1 capsule (2 mg  total) by mouth at bedtime. 11/11/18 06/02/19  Toney Rakes, MD  SRONYX 0.1-20 MG-MCG tablet TAKE ONE TABLET BY MOUTH DAILY FOR 28 DAYS. 12/28/18 06/02/19  [provider]      Allergies    Bee venom, Fire ant, Other, Red dye, Wasp venom, and Eucrisa [crisaborole]    Review of Systems   Review of Systems  Constitutional:  Positive for chills.  HENT:         Rhinorrhea, sore throat  Respiratory:  Positive for cough. Negative for shortness of breath.   Gastrointestinal:  Positive for diarrhea, nausea and vomiting.   Genitourinary:  Positive for frequency, pelvic pain and vaginal discharge. Negative for dysuria.  Skin:  Negative for rash.    Physical Exam Updated Vital Signs BP (!) 171/82 (BP Location: Right Arm)   Pulse 89   Temp 98.1 F (36.7 C)   Resp 16   Ht '5\' 6"'$  (1.676 m)   Wt 95.3 kg   LMP 09/13/2021 (Exact Date)   SpO2 100%   Breastfeeding No   BMI 33.89 kg/m   Physical Exam Vitals and nursing note reviewed.  Constitutional:      General: She is not in acute distress.    Appearance: She is well-developed.  HENT:     Head: Normocephalic and atraumatic.     Right Ear: External ear normal.     Left Ear: External ear normal.     Nose: Congestion present.     Mouth/Throat:     Mouth: Mucous membranes are moist.     Pharynx: Posterior oropharyngeal erythema present. No oropharyngeal exudate.  Eyes:     Extraocular Movements: Extraocular movements intact.     Conjunctiva/sclera: Conjunctivae normal.     Pupils: Pupils are equal, round, and reactive to light.  Cardiovascular:     Rate and Rhythm: Normal rate and regular rhythm.     Heart sounds: No murmur heard. Pulmonary:     Effort: Pulmonary effort is normal. No respiratory distress.     Breath sounds: Normal breath sounds.  Abdominal:     General: Abdomen is flat. There is no distension.     Palpations: Abdomen is soft.     Tenderness: There is abdominal tenderness (suprapubic). There is no right CVA tenderness, left CVA tenderness or guarding.  Musculoskeletal:        General: No swelling.     Cervical back: Normal range of motion and neck supple.  Lymphadenopathy:     Cervical: No cervical adenopathy.  Skin:    General: Skin is warm and dry.     Capillary Refill: Capillary refill takes less than 2 seconds.  Neurological:     General: No focal deficit present.     Mental Status: She is alert and oriented to person, place, and time. Mental status is at baseline.  Psychiatric:        Mood and Affect: Mood normal.      ED Results / Procedures / Treatments   Labs (all labs ordered are listed, but only abnormal results are displayed) Labs Reviewed  URINALYSIS, ROUTINE W REFLEX MICROSCOPIC - Abnormal; Notable for the following components:      Result Value   APPearance HAZY (*)    Protein, ur 30 (*)    Bacteria, UA RARE (*)    All other components within normal limits  RESP PANEL BY RT-PCR (FLU A&B, COVID) ARPGX2  WET PREP, GENITAL  URINE CULTURE  PREGNANCY, URINE  RPR  HIV ANTIBODY (  ROUTINE TESTING W REFLEX)  GC/CHLAMYDIA PROBE AMP ([place]) NOT AT [facility]    EKG None  Radiology No results found.  Procedures Procedures   Medications Ordered in ED Medications  sterile water (preservative free) injection (has no administration in time range)  cefTRIAXone (ROCEPHIN) injection 500 mg (500 mg Intramuscular Given 10/30/21 1337)    ED Course/ Medical Decision Making/ A&P Clinical Course as of 10/30/21 [place] Oct 30, 2021  1312 Chaperoned by Jinny Blossom RN.  External genitalia unremarkable. Nor rashes or lesions noted.  Speculum exam with thick appearing whitish vaginal discharge.  Vaginal wall mucosa is unremarkable.  Cervix visualized and is unremarkable (closed in appearance without any protruding material).  Bimanual exam with CTM and fundal ttp but no adnexal tenderness or any masses appreciated.  Will treat empirically for PID. Will call pt with results of wet prep. She would like rx to be sent to [facility] on [address]  [RP]    Clinical Course User Index [RP] Fransico Meadow, MD                           Medical Decision Making 21 year old female who presents with multiple complaints.  Patient states that for the past 3 days she has had runny nose, sore throat, and nausea vomiting and diarrhea.  Appears that her constitutional sxs and GI sxs are likely from a viral infection. COVID and Flu were obtained and were negative. She does not appear dehydrated on exam and has  reassuring vitals do not feel that electrolytes or IV fluids are warranted at this time.  Patient did have lower abdominal pain and on pelvic exam did have CMT with fundal tenderness to palpation so treated empirically for PID. Swabs sent along with HIV and rpr and are pending. Will call pt with results.  Return precaution discussed with patient prior to discharge.  Amount and/or Complexity of Data Reviewed Labs: ordered.  Risk Prescription drug management.   Final Clinical Impression(s) / ED Diagnoses Final diagnoses:  PID (acute pelvic inflammatory disease)    Rx / DC Orders ED Discharge Orders          Ordered    doxycycline (VIBRAMYCIN) 100 MG capsule  2 times daily        10/30/21 1321    metroNIDAZOLE (FLAGYL) 500 MG tablet  2 times daily        10/30/21 1321              Fransico Meadow, MD 10/30/21 [phone]

## 2021-10-30 NOTE — Discharge Instructions (Signed)
Today you were seen in the emergency department for your runny nose, sore throat, cough as well as your lower abdominal pain and vaginal discharge.    In the emergency department you had a COVID/Flu test that was negative. It is likely that you have an upper respiratory tract infection from anther virus that will resolve without antibiotics.   You had a pelvic exam done and swabs that are pending at this time. You received preventative treatment for PID (pelvic inflammatory disease).    At home, please take the antibiotics we have prescribed you.  Stay well-hydrated for your upper respiratory tract infection.  Follow-up with your primary doctor and OB/GYN in 2-3 days regarding your visit.   Return immediately to the emergency department if you experience any of the following: worsening abdominal pain, persistent fevers, or any other concerning symptoms.    Thank you for visiting our Emergency Department. It was a pleasure taking care of you today.

## 2021-10-30 NOTE — ED Triage Notes (Signed)
Patient does not want her mother to know: She is 13 days late on her period and has been having emesis. Patient reports the emesis happens early in the morning. Endorses breast tenderness and thick vaginal discharge

## 2021-10-30 NOTE — ED Notes (Signed)
Discharge paperwork given and understood. 

## 2021-10-30 NOTE — ED Notes (Signed)
Complaint of vaginal discharge and nausea. Started roughly 10 days ago. Nausea occurs mostly in the morning. Also is late on her period.

## 2021-10-31 LAB — GC/CHLAMYDIA PROBE AMP (~~LOC~~) NOT AT ARMC
Chlamydia: NEGATIVE
Comment: NEGATIVE
Comment: NORMAL
Neisseria Gonorrhea: NEGATIVE

## 2021-10-31 LAB — URINE CULTURE

## 2021-10-31 LAB — RPR: RPR Ser Ql: NONREACTIVE

## 2022-02-08 ENCOUNTER — Encounter (HOSPITAL_COMMUNITY): Payer: Self-pay | Admitting: *Deleted

## 2022-02-08 ENCOUNTER — Ambulatory Visit
Admission: EM | Admit: 2022-02-08 | Discharge: 2022-02-08 | Disposition: A | Discharge status: Home / Self Care | Payer: Medicaid Other | Attending: Internal Medicine | Admitting: Internal Medicine

## 2022-02-08 ENCOUNTER — Emergency Department (HOSPITAL_COMMUNITY)
Admission: EM | Admit: 2022-02-08 | Discharge: 2022-02-09 | Discharge status: Against Medical Advice | Payer: Medicaid Other | Attending: Emergency Medicine | Admitting: Emergency Medicine

## 2022-02-08 ENCOUNTER — Other Ambulatory Visit: Payer: Self-pay

## 2022-02-08 DIAGNOSIS — Z3202 Encounter for pregnancy test, result negative: Secondary | ICD-10-CM | POA: Diagnosis not present

## 2022-02-08 DIAGNOSIS — R030 Elevated blood-pressure reading, without diagnosis of hypertension: Secondary | ICD-10-CM | POA: Diagnosis not present

## 2022-02-08 DIAGNOSIS — M25512 Pain in left shoulder: Secondary | ICD-10-CM

## 2022-02-08 DIAGNOSIS — R059 Cough, unspecified: Secondary | ICD-10-CM | POA: Diagnosis present

## 2022-02-08 DIAGNOSIS — U071 COVID-19: Secondary | ICD-10-CM | POA: Insufficient documentation

## 2022-02-08 DIAGNOSIS — M25511 Pain in right shoulder: Secondary | ICD-10-CM | POA: Insufficient documentation

## 2022-02-08 DIAGNOSIS — J069 Acute upper respiratory infection, unspecified: Secondary | ICD-10-CM

## 2022-02-08 DIAGNOSIS — Z5321 Procedure and treatment not carried out due to patient leaving prior to being seen by health care provider: Secondary | ICD-10-CM | POA: Insufficient documentation

## 2022-02-08 DIAGNOSIS — N912 Amenorrhea, unspecified: Secondary | ICD-10-CM | POA: Insufficient documentation

## 2022-02-08 LAB — POCT URINE PREGNANCY: Preg Test, Ur: NEGATIVE

## 2022-02-08 LAB — SARS CORONAVIRUS 2 (TAT 6-24 HRS): SARS Coronavirus 2: POSITIVE — AB

## 2022-02-08 MED ORDER — BENZONATATE 100 MG PO CAPS: 100.0000 mg | ORAL_CAPSULE | Freq: Three times a day (TID) | ORAL | 0 refills | Status: DC | PRN

## 2022-02-08 MED ORDER — ACETAMINOPHEN 325 MG PO TABS
650.0000 mg | ORAL_TABLET | Freq: Once | ORAL | Status: AC
  Administered 2022-02-08: 650 mg via ORAL
  Filled 2022-02-08: qty 2

## 2022-02-08 MED ORDER — FLUTICASONE PROPIONATE 50 MCG/ACT NA SUSP: 1.0000 | Freq: Every day | NASAL | 0 refills | Status: DC

## 2022-02-08 NOTE — ED Triage Notes (Signed)
Pt presents to [facility] with co of cough and congestion for 4 days.   Pt is also needed pregnancy test. Missed period in aug,slight spotting in sep and missed in oct. Pt reports pcos hx but is having symptoms of pregnancy. Neg at home last week.

## 2022-02-08 NOTE — ED Provider Notes (Addendum)
[name]    CSN: [id number] Arrival date & time: 02/08/22  0836      History   Chief Complaint Chief Complaint  Patient presents with   Cough   Possible Pregnancy    HPI Elizabeth Lam is a 21 y.o. female.   Patient presents with several different chief complaints today.  Patient reports that she has been having cough and nasal congestion for about 4 days.  Denies any known sick contacts or fevers at home.  Denies chest pain, shortness of breath, sore throat, ear pain, nausea, vomiting, diarrhea, abdominal pain.  Patient has not taken any medications for symptoms.  Patient does report that she has history of asthma but has not been having to use her albuterol inhaler more often while being sick.  Patient also requesting pregnancy test due to missed menstrual cycle.  She reports that she had a menstrual cycle in August, and had minimal vaginal spotting in September, they completely missed her menstrual cycle in October.  She denies any associated abdominal pain, vaginal discharge, dysuria, urinary frequency, back pain, fever.  She does endorse that she has been having some bilateral breast tenderness and is not sure if it is related.  She reports that she had protected sexual intercourse but is not sure if the condom broke. She does report history of PCOS and states that she does have irregular menstrual cycles. Denies confirmed exposure to STD. Does not currently take any medications for PCOS at this time.    Cough Possible Pregnancy    Past Medical History:  Diagnosis Date   Anxiety    COVID-19 virus infection 04/2020   Depression    Difficulty sleeping    Morbid obesity (HCC)    OCD (obsessive compulsive disorder)    PCOS (polycystic ovarian syndrome)    Psychogenic nonepileptic seizure    PTSD (post-traumatic stress disorder)    Vaso vagal episode 2021    Patient Active Problem List   Diagnosis Date Noted   Morbid obesity ([place])    COVID-19 virus  infection 04/2020   Psychogenic nonepileptic seizure 01/24/2020   Vaso vagal episode 2021   Insomnia    Vasovagal episode 10/30/2018   Seizure-like activity ([place]) 10/30/2018   Anxiety state 10/30/2018   MDD (major depressive disorder), recurrent severe, 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 Authorizing Provider  benzonatate (TESSALON) 100 MG capsule Take 1 capsule (100 mg total) by mouth every 8 (eight) hours as needed for cough. 02/08/22  Yes , Hildred Alamin E, FNP  fluticasone (FLONASE) 50 MCG/ACT nasal spray Place 1 spray into both nostrils daily. 02/08/22  Yes , Michele Rockers, FNP  cetirizine (ZYRTEC) 10 MG tablet  Take 1 tablet (10 mg total) by mouth daily as needed for allergies or rhinitis. Patient not taking: Reported on 07/23/2021 09/28/20   Kathie Dike, MD  EPINEPHrine 0.3 mg/0.3 mL IJ SOAJ injection Inject 0.3 mg into the muscle once as needed for anaphylaxis. 02/12/19   [provider]  methocarbamol (ROBAXIN) 500 MG tablet Take 1 tablet (500 mg total) by mouth 2 (two) times daily. Patient not taking: Reported on 07/23/2021 06/03/21   Blue, Soijett A, PA-C  metroNIDAZOLE (FLAGYL) 500 MG tablet Take 1 tablet (500 mg total) by mouth 2 (two) times daily. 10/30/21   Fransico Meadow, MD  naproxen (NAPROSYN) 500 MG tablet Take 1 tablet (500 mg total) by mouth 2 (two) times daily as needed for moderate pain. Patient not taking: Reported on 07/23/2021 05/06/21   Petrucelli, Samantha R, PA-C   ondansetron (ZOFRAN-ODT) 4 MG disintegrating tablet Take 1 tablet (4 mg total) by mouth every 8 (eight) hours as needed for nausea or vomiting. Patient not taking: Reported on 07/23/2021 05/06/21   Petrucelli, Glynda Jaeger, PA-C  PROAIR HFA 108 (90 Base) MCG/ACT inhaler Inhale 2 puffs into the lungs every 6 (six) hours as needed for wheezing or shortness of breath.    [provider]  triamcinolone cream (KENALOG) 0.1 % Apply 1 application topically 3 (three) times daily. Patient not taking: Reported on 07/23/2021    [provider]  ipratropium (ATROVENT) 0.06 % nasal spray Place 2 sprays into both nostrils 4 (four) times daily. 02/21/19 06/02/19  Ok Edwards, PA-C  metoCLOPramide (REGLAN) 10 MG tablet Take 1 tablet (10 mg total) by mouth every 6 (six) hours. 02/21/19 06/02/19  Tasia Catchings, Amy V, PA-C  prazosin (MINIPRESS) 2 MG capsule Take 1 capsule (2 mg total) by mouth at bedtime. 11/11/18 06/02/19  Toney Rakes, MD  SRONYX 0.1-20 MG-MCG tablet TAKE ONE TABLET BY MOUTH DAILY FOR 28 DAYS. 12/28/18 06/02/19  [provider]    Family History Family History  Problem Relation Age of Onset   Arrhythmia Mother        [place], has ICD   Healthy Father     Social History Social History   Tobacco Use   Smoking status: Never    Passive exposure: Past   Smokeless tobacco: Never  Vaping Use   Vaping Use: Some days   Substances: Nicotine, Flavoring  Substance Use Topics   Alcohol use: Never   Drug use: Yes    Frequency: 2.0 times per week    Types: Marijuana     Allergies   Bee venom, Fire ant, Other, Red dye, Wasp venom, and Eucrisa [crisaborole]   Review of Systems Review of Systems Per HPI  Physical Exam Triage Vital Signs ED Triage Vitals  Enc Vitals Group     BP 02/08/22 0852 (!) 154/107     Pulse Rate 02/08/22 0852 78     Resp 02/08/22 0852 19     Temp 02/08/22 0852 98.2 F (36.8 C)     Temp Source 02/08/22 0852 Oral     SpO2 02/08/22 0852 98 %     Weight  --      Height --      Head Circumference --      Peak Flow --      Pain Score 02/08/22 0851 0     Pain Loc --      Pain Edu? --      Excl. in [place]? --    No data found.  Updated Vital Signs BP (!) 149/99   Pulse 78   Temp 98.2 F (36.8 C) (Oral)   Resp 19   LMP 11/26/2021 (Exact Date)   SpO2 98%   Visual Acuity Right Eye Distance:   Left Eye Distance:   Bilateral Distance:    Right Eye Near:   Left Eye Near:    Bilateral Near:     Physical Exam Constitutional:      General: She is not in acute distress.    Appearance: Normal appearance. She is not toxic-appearing or diaphoretic.  HENT:     Head: Normocephalic and atraumatic.     Right Ear: Tympanic membrane and ear canal normal.     Left Ear: Tympanic membrane and ear canal normal.     Nose: Congestion present.     Mouth/Throat:     Mouth: Mucous membranes are moist.     Pharynx: No posterior oropharyngeal erythema.  Eyes:     Extraocular Movements: Extraocular movements intact.     Conjunctiva/sclera: Conjunctivae normal.     Pupils: Pupils are equal, round, and reactive to light.  Cardiovascular:     Rate and Rhythm: Normal rate and regular rhythm.     Pulses: Normal pulses.     Heart sounds: Normal heart sounds.  Pulmonary:     Effort: Pulmonary effort is normal. No respiratory distress.     Breath sounds: Normal breath sounds. No wheezing.  Abdominal:     General: Abdomen is flat. Bowel sounds are normal. There is no distension.     Palpations: Abdomen is soft.     Tenderness: There is no abdominal tenderness.  Musculoskeletal:        General: Normal range of motion.     Cervical back: Normal range of motion.  Skin:    General: Skin is warm and dry.  Neurological:     General: No focal deficit present.     Mental Status: She is alert and oriented to person, place, and time. Mental status is at baseline.  Psychiatric:        Mood and Affect: Mood normal.        Behavior: Behavior normal.         Thought Content: Thought content normal.        Judgment: Judgment normal.      UC Treatments / Results  Labs (all labs ordered are listed, but only abnormal results are displayed) Labs Reviewed  SARS CORONAVIRUS 2 (TAT 6-24 HRS)  BETA HCG QUANT (REF LAB)  POCT URINE PREGNANCY    EKG   Radiology No results found.  Procedures Procedures (including critical care time)  Medications Ordered in UC Medications - No data to display  Initial Impression / Assessment and Plan / UC Course  I have reviewed the triage vital signs and the nursing notes.  Pertinent labs & imaging results that were available during my care of the patient were reviewed by me and considered in my medical decision making (see chart for details).     Patient presents with symptoms likely from a viral upper respiratory infection. Differential includes bacterial pneumonia, sinusitis, allergic rhinitis, Covid 19, flu, RSV. Do not suspect underlying cardiopulmonary process. Symptoms seem unlikely related to ACS, CHF or COPD exacerbations, pneumonia, pneumothorax. Patient is nontoxic appearing and not in need of emergent medical intervention.  covid test pending.  Recommended symptom control with over the counter medications. Patient sent prescriptions for medications.  Urine pregnancy test was negative.  Will obtain quantitative  hCG given amenorrhea.  Suspect patient's irregular menstrual cycles are due to PCOS.  Advised patient to follow-up with established gynecologist for further evaluation and management.  Patient is not having any STD or vaginitis related symptoms so do not think that amenorrhea is due to this so testing was deferred.  Return if symptoms fail to improve in 1-2 weeks or you develop shortness of breath, chest pain, severe headache. Patient states understanding and is agreeable.  Discharged with PCP followup.  Final Clinical Impressions(s) / UC Diagnoses   Final diagnoses:  Viral upper  respiratory tract infection with cough  Urine pregnancy test negative  Amenorrhea  Elevated blood pressure reading     Discharge Instructions      It appears that you have a viral upper respiratory infection that should run its course and self resolve with symptomatic treatment as we discussed.  I have prescribed you 2 medications to alleviate symptoms.  COVID test is pending.  We will call if it is positive.  Please follow-up if these symptoms persist or worsen.  Your urine pregnancy test was negative.  Your blood pregnancy test is pending.  Please follow-up with your gynecologist for further evaluation and management of your missed menstrual cycle.    ED Prescriptions     Medication Sig Dispense Auth. Provider   fluticasone (FLONASE) 50 MCG/ACT nasal spray Place 1 spray into both nostrils daily. 16 g , Hildred Alamin E, [place]   benzonatate (TESSALON) 100 MG capsule Take 1 capsule (100 mg total) by mouth every 8 (eight) hours as needed for cough. 21 capsule [place], Michele Rockers, [place]      PDMP not reviewed this encounter.   Teodora Medici, [place] 02/08/22 [place], [place],  02/08/22 [phone]

## 2022-02-08 NOTE — ED Provider Triage Note (Cosign Needed)
Emergency Medicine Provider Triage Evaluation Note  Elizabeth Lam , a 21 y.o. female  was evaluated in triage.  Pt complains of cough for several days, seen in urgent care this morning.  Began to have severe right shoulder pain that is stabbing in nature worse with movement after the evaluation in urgent care this morning.  No history of trauma to the area no numbness tingling weakness in the arm, no rashes..  Review of Systems  Positive: Cough, runny nose, as above Negative: Fevers chills nausea vomiting diarrhe  Physical Exam  BP (!) 131/98   Pulse (!) 101   Temp 100.3 F (37.9 C) (Oral)   Resp 16   LMP 11/26/2021 (Exact Date)   SpO2 100%  Gen:   Awake, no distress   Resp:  Normal effort  MSK:   Moves extremities without difficulty  Other:  Muscle spasm of the right trapezius and cervical and thoracic paraspinous musculature on the right without skin changes.  Full passive range of motion with some tenderness palpation and pain with movement. Medical Decision Making  Medically screening exam initiated at 11:23 PM.  Appropriate orders placed.  Elizabeth Lam was informed that the remainder of the evaluation will be completed by another provider, this initial triage assessment does not replace that evaluation, and the importance of remaining in the ED until their evaluation is complete.  Pain appears musculoskeletal.  Patient anxious, desiring x-ray.  We will proceed.  Patient also informed that she tested positive for COVID-19 urgent care this morning.  This chart was dictated using voice recognition software, Dragon. Despite the best efforts of this provider to proofread and correct errors, errors may still occur which can change documentation meaning.    Emeline Darling, PA-C 02/08/22 2325

## 2022-02-08 NOTE — ED Triage Notes (Signed)
Pt with posterior right shoulder pain that started around 10:30 this morning, pt says she is unable to lift her arm above her head d/t the pain. Pain is worse to lying down and to movement.

## 2022-02-08 NOTE — Discharge Instructions (Signed)
It appears that you have a viral upper respiratory infection that should run its course and self resolve with symptomatic treatment as we discussed.  I have prescribed you 2 medications to alleviate symptoms.  COVID test is pending.  We will call if it is positive.  Please follow-up if these symptoms persist or worsen.  Your urine pregnancy test was negative.  Your blood pregnancy test is pending.  Please follow-up with your gynecologist for further evaluation and management of your missed menstrual cycle.

## 2022-02-09 ENCOUNTER — Emergency Department (HOSPITAL_COMMUNITY): Payer: Medicaid Other

## 2022-02-09 LAB — BETA HCG QUANT (REF LAB): hCG Quant: <1 m[IU]/mL

## 2022-02-09 NOTE — ED Notes (Signed)
A&O no confusion. Patient requested to leave AMA. Risk and benefit explained and patient understood. NO s/s of any distress. No verbal c/o pain or discomfort. MD made aware.

## 2022-02-09 NOTE — ED Provider Notes (Signed)
Patient left prior to my evaluation    Sherrell Puller, PA-C 02/09/22 [place], [place], DO 02/09/22 [phone]

## 2022-04-08 IMAGING — DX DG LUMBAR SPINE COMPLETE 4+V
5 series · 5 of 5 positions shown · non-contrast
Comparison: None.

CLINICAL DATA: Low back pain for 4 months.  No reported injury.

EXAM:
LUMBAR SPINE - COMPLETE 4+ VIEW

[l-spine ap]
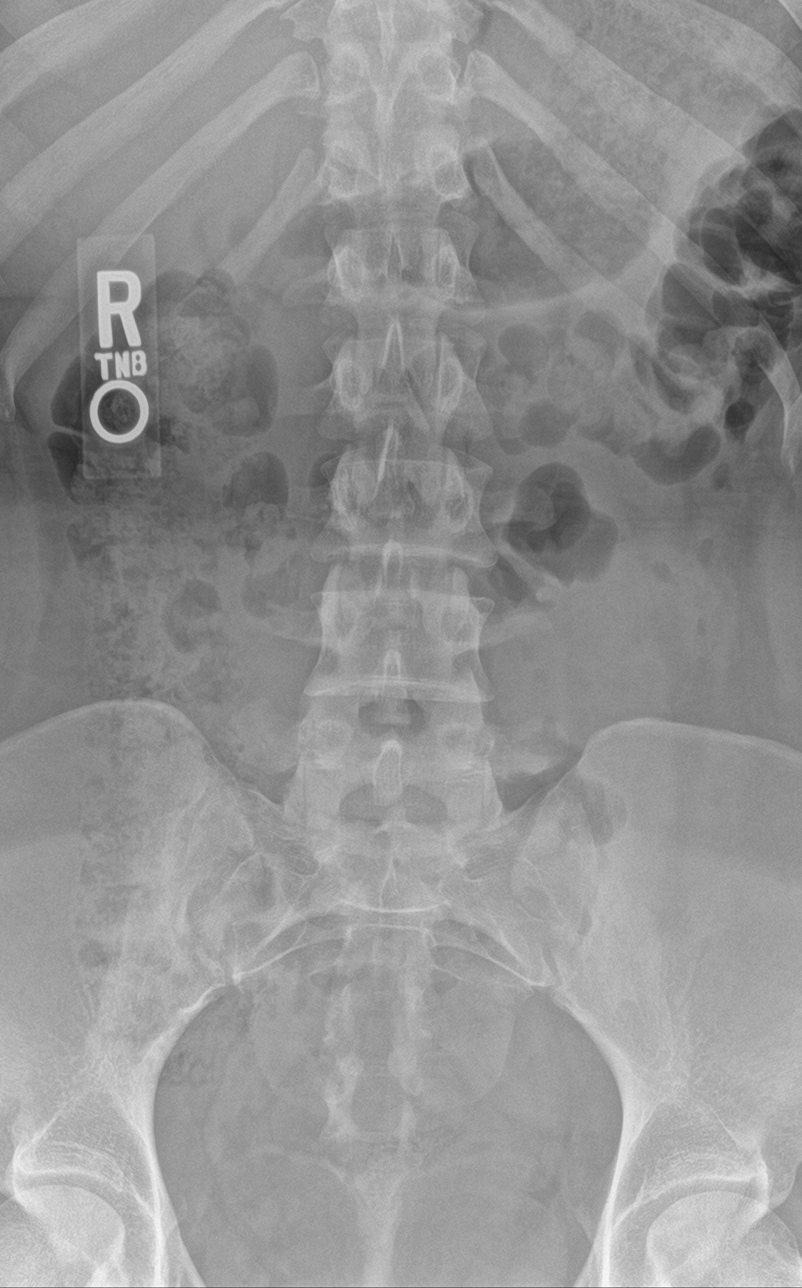

[l-spine obl (1 of 2)]
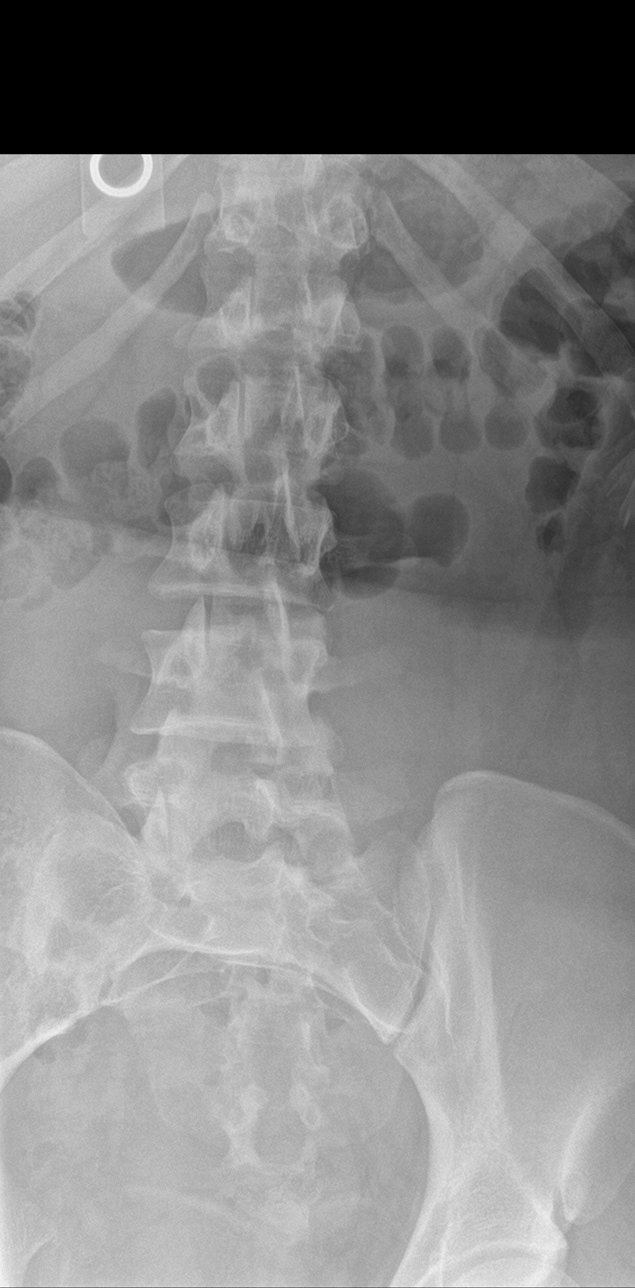

[l-spine obl (2 of 2)]
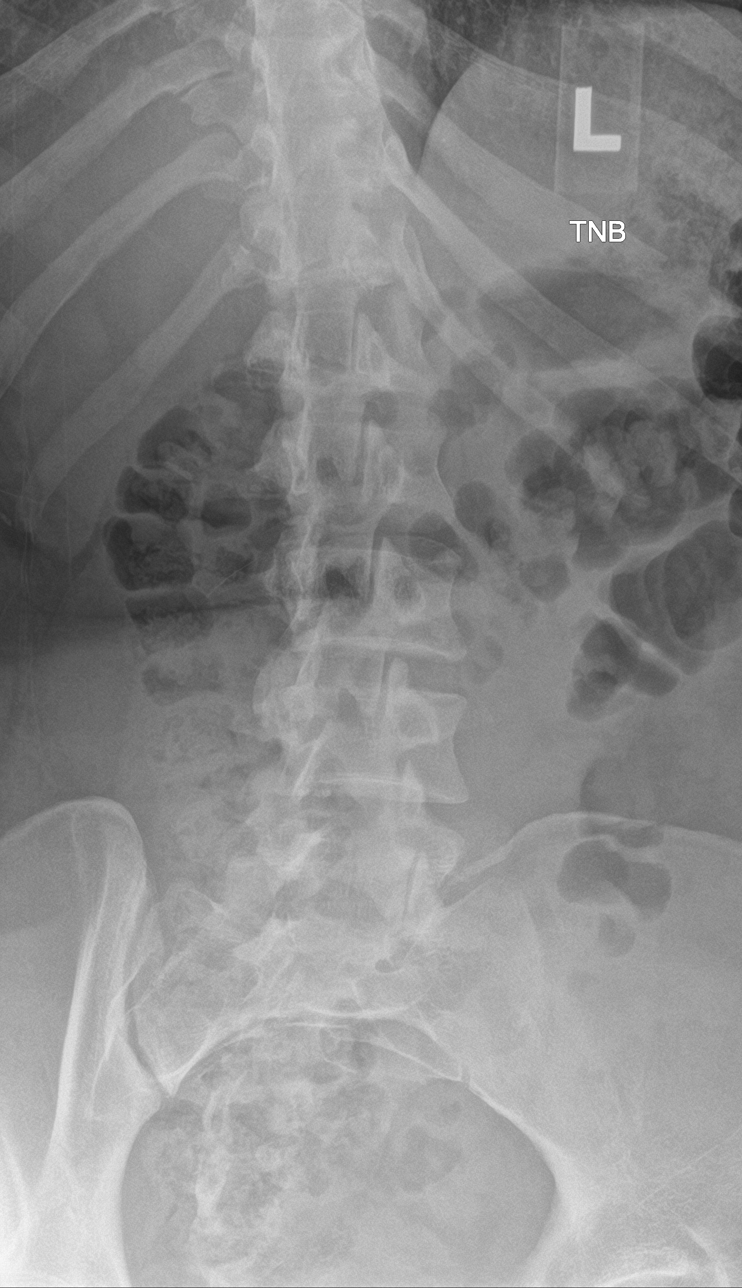

[l-spine lat]
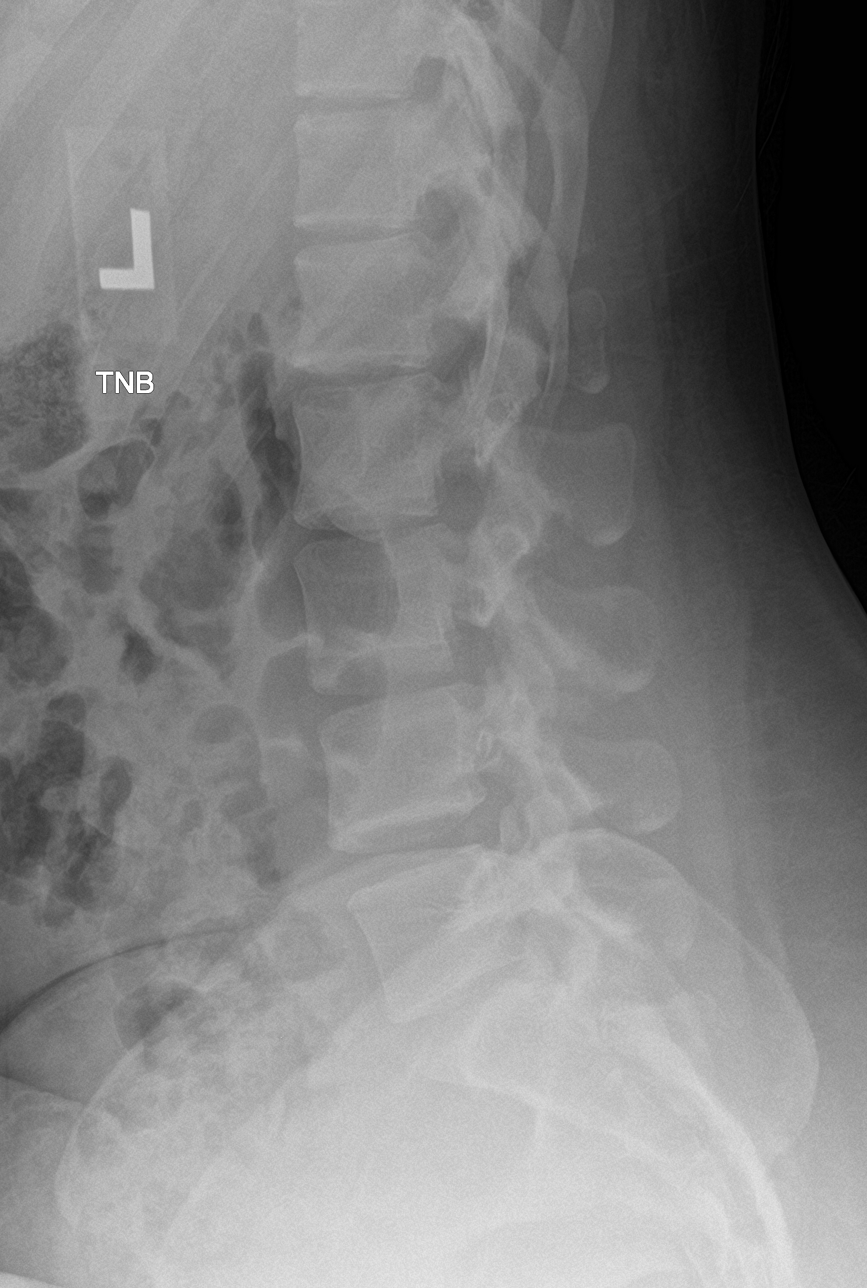

[l-spine spot]
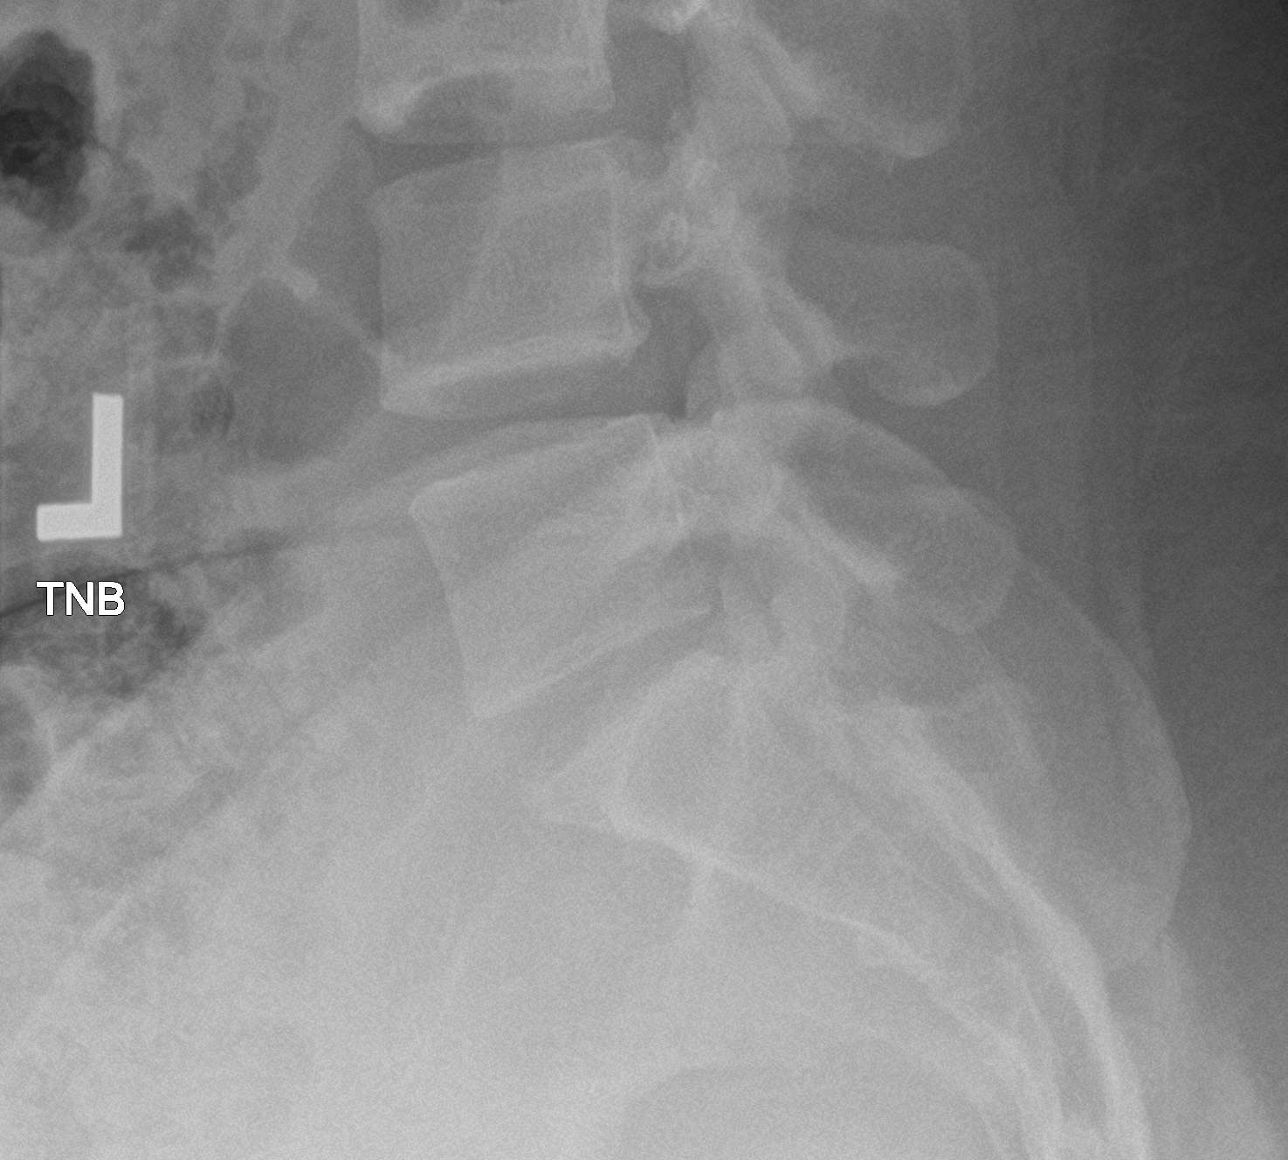

[5 of 5 positions shown; findings below may reference images not displayed]

FINDINGS: This report assumes 5 non rib-bearing lumbar vertebrae.

Lumbar vertebral body heights are preserved, with no fracture.

Lumbar disc heights are preserved. No spondylosis. No
spondylolisthesis. No appreciable facet arthropathy. No aggressive
appearing focal osseous lesions.
IMPRESSION: Normal lumbar spine radiographs.

## 2022-08-02 ENCOUNTER — Emergency Department (HOSPITAL_COMMUNITY)
Admission: EM | Admit: 2022-08-02 | Discharge: 2022-08-02 | Disposition: A | Discharge status: Home / Self Care | Payer: Medicaid Other | Attending: Student | Admitting: Student

## 2022-08-02 ENCOUNTER — Encounter (HOSPITAL_COMMUNITY): Payer: Self-pay

## 2022-08-02 ENCOUNTER — Other Ambulatory Visit: Payer: Self-pay

## 2022-08-02 DIAGNOSIS — B9689 Other specified bacterial agents as the cause of diseases classified elsewhere: Secondary | ICD-10-CM | POA: Diagnosis not present

## 2022-08-02 DIAGNOSIS — R350 Frequency of micturition: Secondary | ICD-10-CM | POA: Diagnosis not present

## 2022-08-02 DIAGNOSIS — N760 Acute vaginitis: Secondary | ICD-10-CM | POA: Diagnosis not present

## 2022-08-02 DIAGNOSIS — N898 Other specified noninflammatory disorders of vagina: Secondary | ICD-10-CM | POA: Diagnosis present

## 2022-08-02 LAB — WET PREP, GENITAL
Sperm: NONE SEEN
Trich, Wet Prep: NONE SEEN
WBC, Wet Prep HPF POC: <10 (ref <10)
Yeast Wet Prep HPF POC: NONE SEEN

## 2022-08-02 LAB — URINALYSIS, ROUTINE W REFLEX MICROSCOPIC
Bilirubin Urine: NEGATIVE
Glucose, UA: NEGATIVE mg/dL
Hgb urine dipstick: NEGATIVE
Ketones, ur: NEGATIVE mg/dL
Leukocytes,Ua: NEGATIVE
Nitrite: NEGATIVE
Protein, ur: NEGATIVE mg/dL
Specific Gravity, Urine: 1.004 — ABNORMAL LOW (ref 1.005–1.030)
pH: 7 (ref 5.0–8.0)

## 2022-08-02 LAB — GC/CHLAMYDIA PROBE AMP (~~LOC~~) NOT AT ARMC
Chlamydia: NEGATIVE
Comment: NEGATIVE
Comment: NORMAL
Neisseria Gonorrhea: NEGATIVE

## 2022-08-02 LAB — CBC
HCT: 45.5 % (ref 36.0–46.0)
Hemoglobin: 14.9 g/dL (ref 12.0–15.0)
MCH: 29.6 pg (ref 26.0–34.0)
MCHC: 32.7 g/dL (ref 30.0–36.0)
MCV: 90.5 fL (ref 80.0–100.0)
Platelets: 327 10*3/uL (ref 150–400)
RBC: 5.03 MIL/uL (ref 3.87–5.11)
RDW: 12.7 % (ref 11.5–15.5)
WBC: 8.8 10*3/uL (ref 4.0–10.5)
nRBC: 0 % (ref 0.0–0.2)

## 2022-08-02 LAB — BASIC METABOLIC PANEL
Anion gap: 11 (ref 5–15)
BUN: 7 mg/dL (ref 6–20)
CO2: 27 mmol/L (ref 22–32)
Calcium: 9.9 mg/dL (ref 8.9–10.3)
Chloride: 102 mmol/L (ref 98–111)
Creatinine, Ser: 1.15 mg/dL — ABNORMAL HIGH (ref 0.44–1.00)
GFR, Estimated: >60 mL/min (ref >60)
Glucose, Bld: 109 mg/dL — ABNORMAL HIGH (ref 70–99)
Potassium: 4 mmol/L (ref 3.5–5.1)
Sodium: 140 mmol/L (ref 135–145)

## 2022-08-02 LAB — I-STAT BETA HCG BLOOD, ED (MC, WL, AP ONLY): I-stat hCG, quantitative: <5 m[IU]/mL (ref <5)

## 2022-08-02 MED ORDER — METRONIDAZOLE 500 MG PO TABS: 500.0000 mg | ORAL_TABLET | Freq: Two times a day (BID) | ORAL | 0 refills | Status: DC

## 2022-08-02 NOTE — ED Notes (Signed)
Pt ambulatory to room with steady gait c/o 5 day hx of vaginal discharge odor and urinary frequency. Boyfriend at bedside. Pt a/o x 4 respirations even and non labored denies n/v/d and abdominal pain. Pelvic cart to bedside

## 2022-08-02 NOTE — ED Triage Notes (Signed)
Patient complaint of vaginal discharge with odor, increased urinary frequency x 5 days

## 2022-08-02 NOTE — Discharge Instructions (Addendum)
Take Flagyl as prescribed until finished.  Follow-up with your OB/GYN.  Use condoms when sexually active.  Return for new or concerning symptoms.

## 2022-08-02 NOTE — ED Provider Notes (Signed)
[place] EMERGENCY DEPARTMENT AT [facility] Provider Note   CSN: [id number] Arrival date & time: 08/02/22  0149     History  Chief Complaint  Patient presents with   Vaginal Discharge   Urinary Frequency    Elizabeth Lam is a 22 y.o. female.  22 year old female presenting to the emergency department with her boyfriend.  She is complaining of 5 days of a vaginal discharge.  She states that she has discharge at baseline, but feels that it has been thicker lately and more atypical for her.  She had urinary frequency on the first day of increased discharge, but no urinary symptoms since this time.  No fevers, abdominal pain, nausea, vomiting, diarrhea.  She has been sexually active with 1 female partner in the past 6 months and denies the use of condoms when sexually active.  Denies concern for STDs.  The history is provided by the patient and a friend. No language interpreter was used.  Vaginal Discharge Urinary Frequency       Home Medications Prior to Admission medications   Medication Sig Start Date End Date Taking? Authorizing Provider  metroNIDAZOLE (FLAGYL) 500 MG tablet Take 1 tablet (500 mg total) by mouth 2 (two) times daily. 08/02/22  Yes Antony Madura, PA-C  benzonatate (TESSALON) 100 MG capsule Take 1 capsule (100 mg total) by mouth every 8 (eight) hours as needed for cough. 02/08/22   Gustavus Bryant, FNP  EPINEPHrine 0.3 mg/0.3 mL IJ SOAJ injection Inject 0.3 mg into the muscle once as needed for anaphylaxis. 02/12/19   [provider]  fluticasone (FLONASE) 50 MCG/ACT nasal spray Place 1 spray into both nostrils daily. 02/08/22   Gustavus Bryant, FNP  PROAIR HFA 108 [phone] Base) MCG/ACT inhaler Inhale 2 puffs into the lungs every 6 (six) hours as needed for wheezing or shortness of breath.    [provider]  ipratropium (ATROVENT) 0.06 % nasal spray Place 2 sprays into both nostrils 4 (four) times daily. 02/21/19 06/02/19  Belinda Fisher, PA-C   metoCLOPramide (REGLAN) 10 MG tablet Take 1 tablet (10 mg total) by mouth every 6 (six) hours. 02/21/19 06/02/19  Cathie Hoops, Amy V, PA-C  prazosin (MINIPRESS) 2 MG capsule Take 1 capsule (2 mg total) by mouth at bedtime. 11/11/18 06/02/19  Randall Hiss, MD  SRONYX 0.1-20 MG-MCG tablet TAKE ONE TABLET BY MOUTH DAILY FOR 28 DAYS. 12/28/18 06/02/19  [provider]      Allergies    Bee venom, Fire ant, Other, Red dye, Wasp venom, and Eucrisa [crisaborole]    Review of Systems   Review of Systems  Genitourinary:  Positive for frequency and vaginal discharge.  Ten systems reviewed and are negative for acute change, except as noted in the HPI.    Physical Exam Updated Vital Signs BP 132/87 (BP Location: Right Arm)   Pulse (!) 108   Temp 98.2 F (36.8 C)   Resp 18   Ht 5\' 6"  (1.676 m)   Wt 86.2 kg   SpO2 100%   BMI 30.67 kg/m   Physical Exam Vitals and nursing note reviewed.  Constitutional:      General: She is not in acute distress.    Appearance: She is well-developed. She is not diaphoretic.     Comments: Nontoxic appearing and in NAD  HENT:     Head: Normocephalic and atraumatic.  Eyes:     General: No scleral icterus.    Conjunctiva/sclera: Conjunctivae normal.  Pulmonary:  Effort: Pulmonary effort is normal. No respiratory distress.     Comments: Respirations even and unlabored Genitourinary:    Comments: Exam chaperoned by RN. Minimal white discharge in vaginal vault. No malodorous discharge, bleeding, genital lesions. Musculoskeletal:        General: Normal range of motion.     Cervical back: Normal range of motion.  Skin:    General: Skin is warm and dry.     Coloration: Skin is not pale.     Findings: No erythema or rash.  Neurological:     Mental Status: She is alert and oriented to person, place, and time.  Psychiatric:        Behavior: Behavior normal.     ED Results / Procedures / Treatments   Labs (all labs ordered are listed, but only  abnormal results are displayed) Labs Reviewed  WET PREP, GENITAL - Abnormal; Notable for the following components:      Result Value   Clue Cells Wet Prep HPF POC PRESENT (*)    All other components within normal limits  URINALYSIS, ROUTINE W REFLEX MICROSCOPIC - Abnormal; Notable for the following components:   Color, Urine STRAW (*)    Specific Gravity, Urine 1.004 (*)    All other components within normal limits  BASIC METABOLIC PANEL - Abnormal; Notable for the following components:   Glucose, Bld 109 (*)    Creatinine, Ser 1.15 (*)    All other components within normal limits  CBC  I-STAT BETA HCG BLOOD, ED ([facility], [facility], [facility] ONLY)  GC/CHLAMYDIA PROBE AMP ([place]) NOT AT [facility]    EKG None  Radiology No results found.  Procedures Procedures    Medications Ordered in ED Medications - No data to display  ED Course/ Medical Decision Making/ A&P                             Medical Decision Making Amount and/or Complexity of Data Reviewed Labs: ordered.   This patient presents to the ED for concern of vaginal discharge, this involves an extensive number of treatment options, and is a complaint that carries with it a high risk of complications and morbidity.  The differential diagnosis includes normal variant vs trichomoniasis vs other STI vs vaginitis   Co morbidities that complicate the patient evaluation  None    Additional history obtained:  Additional history obtained from boyfriend, at bedside.   Lab Tests:  I Ordered, and personally interpreted labs.  The pertinent results include:  Creatinine 1.15; otherwise normal CBC, BMP, UA. Negative pregnancy. Wet prep with clue cells present.   Medicines ordered and prescription drug management:  I ordered medication including Flagyl for BV  I have reviewed the patients home medicines and have made adjustments as needed   Test Considered:  HIV screen   Problem List / ED Course:  Wet prep with clue  cells, <10 WBCs. Given that patient is symptomatic with c/o increased discharge, will start on Flagyl. Upreg negative and UA w/o evidence of UTI.   Reevaluation:  After the interventions noted above, I reevaluated the patient and found that they have :stayed the same   Social Determinants of Health:  Hx unprotected sex   Dispostion:  After consideration of the diagnostic results and the patients response to treatment, I feel that the patent would benefit from outpatient OBGYN follow up and course of Flagyl. Return precautions discussed and provided. Patient discharged in stable condition  with no unaddressed concerns.          Final Clinical Impression(s) / ED Diagnoses Final diagnoses:  BV (bacterial vaginosis)    Rx / DC Orders ED Discharge Orders          Ordered    metroNIDAZOLE (FLAGYL) 500 MG tablet  2 times daily        08/02/22 0340              Antony Madura, PA-C 08/02/22 0345    [place], [place], MD 08/03/22 1144

## 2022-09-21 ENCOUNTER — Other Ambulatory Visit: Payer: Self-pay

## 2022-09-21 ENCOUNTER — Emergency Department (HOSPITAL_COMMUNITY)
Admission: EM | Admit: 2022-09-21 | Discharge: 2022-09-21 | Disposition: A | Discharge status: Home / Self Care | Payer: Medicaid Other | Attending: Emergency Medicine | Admitting: Emergency Medicine

## 2022-09-21 DIAGNOSIS — N760 Acute vaginitis: Secondary | ICD-10-CM | POA: Diagnosis not present

## 2022-09-21 DIAGNOSIS — R5383 Other fatigue: Secondary | ICD-10-CM | POA: Diagnosis not present

## 2022-09-21 DIAGNOSIS — R519 Headache, unspecified: Secondary | ICD-10-CM | POA: Insufficient documentation

## 2022-09-21 DIAGNOSIS — B9689 Other specified bacterial agents as the cause of diseases classified elsewhere: Secondary | ICD-10-CM | POA: Insufficient documentation

## 2022-09-21 LAB — CBC WITH DIFFERENTIAL/PLATELET
Abs Immature Granulocytes: 0.01 10*3/uL (ref 0.00–0.07)
Basophils Absolute: 0 10*3/uL (ref 0.0–0.1)
Basophils Relative: 1 %
Eosinophils Absolute: 0 10*3/uL (ref 0.0–0.5)
Eosinophils Relative: 0 %
HCT: 43.5 % (ref 36.0–46.0)
Hemoglobin: 14.1 g/dL (ref 12.0–15.0)
Immature Granulocytes: 0 %
Lymphocytes Relative: 29 %
Lymphs Abs: 1.5 10*3/uL (ref 0.7–4.0)
MCH: 29.7 pg (ref 26.0–34.0)
MCHC: 32.4 g/dL (ref 30.0–36.0)
MCV: 91.6 fL (ref 80.0–100.0)
Monocytes Absolute: 0.4 10*3/uL (ref 0.1–1.0)
Monocytes Relative: 7 %
Neutro Abs: 3.2 10*3/uL (ref 1.7–7.7)
Neutrophils Relative %: 63 %
Platelets: 269 10*3/uL (ref 150–400)
RBC: 4.75 MIL/uL (ref 3.87–5.11)
RDW: 13.1 % (ref 11.5–15.5)
WBC: 5.1 10*3/uL (ref 4.0–10.5)
nRBC: 0 % (ref 0.0–0.2)

## 2022-09-21 LAB — MAGNESIUM: Magnesium: 1.8 mg/dL (ref 1.7–2.4)

## 2022-09-21 LAB — COMPREHENSIVE METABOLIC PANEL
ALT: 65 U/L — ABNORMAL HIGH (ref 0–44)
AST: 38 U/L (ref 15–41)
Albumin: 3.9 g/dL (ref 3.5–5.0)
Alkaline Phosphatase: 42 U/L (ref 38–126)
Anion gap: 7 (ref 5–15)
BUN: 9 mg/dL (ref 6–20)
CO2: 26 mmol/L (ref 22–32)
Calcium: 9.1 mg/dL (ref 8.9–10.3)
Chloride: 105 mmol/L (ref 98–111)
Creatinine, Ser: 1.11 mg/dL — ABNORMAL HIGH (ref 0.44–1.00)
GFR, Estimated: >60 mL/min (ref >60)
Glucose, Bld: 104 mg/dL — ABNORMAL HIGH (ref 70–99)
Potassium: 3.6 mmol/L (ref 3.5–5.1)
Sodium: 138 mmol/L (ref 135–145)
Total Bilirubin: 0.6 mg/dL (ref 0.3–1.2)
Total Protein: 7 g/dL (ref 6.5–8.1)

## 2022-09-21 LAB — URINALYSIS, ROUTINE W REFLEX MICROSCOPIC
Bacteria, UA: NONE SEEN
Bilirubin Urine: NEGATIVE
Glucose, UA: NEGATIVE mg/dL
Hgb urine dipstick: NEGATIVE
Ketones, ur: NEGATIVE mg/dL
Leukocytes,Ua: NEGATIVE
Nitrite: NEGATIVE
Protein, ur: 30 mg/dL — AB
Specific Gravity, Urine: 1.019 (ref 1.005–1.030)
pH: 6 (ref 5.0–8.0)

## 2022-09-21 LAB — WET PREP, GENITAL
Sperm: NONE SEEN
Trich, Wet Prep: NONE SEEN
WBC, Wet Prep HPF POC: >=10 — AB (ref <10)
Yeast Wet Prep HPF POC: NONE SEEN

## 2022-09-21 LAB — I-STAT BETA HCG BLOOD, ED (MC, WL, AP ONLY): I-stat hCG, quantitative: <5 m[IU]/mL (ref <5)

## 2022-09-21 MED ORDER — METOCLOPRAMIDE HCL 5 MG/ML IJ SOLN
10.0000 mg | Freq: Once | INTRAMUSCULAR | Status: AC
  Administered 2022-09-21: 10 mg via INTRAVENOUS
  Filled 2022-09-21: qty 2

## 2022-09-21 MED ORDER — KETOROLAC TROMETHAMINE 30 MG/ML IJ SOLN
30.0000 mg | Freq: Once | INTRAMUSCULAR | Status: AC
  Administered 2022-09-21: 30 mg via INTRAVENOUS
  Filled 2022-09-21: qty 1

## 2022-09-21 MED ORDER — METRONIDAZOLE 500 MG PO TABS: 500.0000 mg | ORAL_TABLET | Freq: Two times a day (BID) | ORAL | 0 refills | Status: DC

## 2022-09-21 MED ORDER — DIPHENHYDRAMINE HCL 50 MG/ML IJ SOLN
12.5000 mg | Freq: Once | INTRAMUSCULAR | Status: AC
  Administered 2022-09-21: 12.5 mg via INTRAVENOUS
  Filled 2022-09-21: qty 1

## 2022-09-21 MED ORDER — SODIUM CHLORIDE 0.9 % IV BOLUS
1000.0000 mL | Freq: Once | INTRAVENOUS | Status: AC
  Administered 2022-09-21: 1000 mL via INTRAVENOUS

## 2022-09-21 NOTE — ED Triage Notes (Signed)
Pt c/o malaise for several days. Pt also c/o shooting pain into their labia.  AOx4

## 2022-09-21 NOTE — ED Provider Notes (Signed)
[place] EMERGENCY DEPARTMENT AT [facility] Provider Note   CSN: [id number] Arrival date & time: 09/21/22  1606     History  Chief Complaint  Patient presents with   Fatigue   vulvar pain    Elizabeth Lam is a 22 y.o. female.  With a history of anxiety, depression, PTSD, PCOS, migraines who presents to the ED for evaluation of headache and vaginal pain.  She states she has had intermittent headaches for the past week.  States they last a few hours at a time and then go away.  2 days ago the headache began and she has been unable to get rid of it.  Localizes the pain behind both earlobes.  Describes it as a sharp sensation.  States this is different than her previous migraines.  She states she used to take Topamax for her migraines but stopped this in 2017.  She reports scintillating scotomas as well.  Pain was insidious in onset and has progressively gotten worse.  She had 1 episode of vomiting yesterday.  She denies numbness, weakness, tingling, fevers, neck stiffness, seizure-like activity.  She is also complaining of vaginal pain.  Describes this as a sharp and shooting sensation mostly to the left side of her groin into the vagina.  This has been present for the past few months.  She believes this may be secondary to her PCOS versus endometriosis.  She has not followed up with gynecology regarding these pains.  They have not changed recently.  She does report some increase in her vaginal discharge but denies itching, odor, bleeding.  States her last menstrual cycle was in August of last year.  She is not on any birth control.  She is sexually active with 1 female partner.  They do not use protection.  She voices some concern about possible infidelity in the relationship.  HPI     Home Medications Prior to Admission medications   Medication Sig Start Date End Date Taking? Authorizing Provider  metroNIDAZOLE (FLAGYL) 500 MG tablet Take 1 tablet (500 mg total) by mouth 2 (two)  times daily. 09/21/22  Yes Adeleine Pask, Edsel Petrin, PA-C  benzonatate (TESSALON) 100 MG capsule Take 1 capsule (100 mg total) by mouth every 8 (eight) hours as needed for cough. 02/08/22   Gustavus Bryant, FNP  EPINEPHrine 0.3 mg/0.3 mL IJ SOAJ injection Inject 0.3 mg into the muscle once as needed for anaphylaxis. 02/12/19   [provider]  fluticasone (FLONASE) 50 MCG/ACT nasal spray Place 1 spray into both nostrils daily. 02/08/22   Gustavus Bryant, FNP  metroNIDAZOLE (FLAGYL) 500 MG tablet Take 1 tablet (500 mg total) by mouth 2 (two) times daily. 08/02/22   Antony Madura, PA-C  PROAIR HFA 108 (90 Base) MCG/ACT inhaler Inhale 2 puffs into the lungs every 6 (six) hours as needed for wheezing or shortness of breath.    [provider]  ipratropium (ATROVENT) 0.06 % nasal spray Place 2 sprays into both nostrils 4 (four) times daily. 02/21/19 06/02/19  Belinda Fisher, PA-C  metoCLOPramide (REGLAN) 10 MG tablet Take 1 tablet (10 mg total) by mouth every 6 (six) hours. 02/21/19 06/02/19  Cathie Hoops, Amy V, PA-C  prazosin (MINIPRESS) 2 MG capsule Take 1 capsule (2 mg total) by mouth at bedtime. 11/11/18 06/02/19  Randall Hiss, MD  SRONYX 0.1-20 MG-MCG tablet TAKE ONE TABLET BY MOUTH DAILY FOR 28 DAYS. 12/28/18 06/02/19  [provider]      Allergies  Bee venom, Fire ant, Other, Red dye, Wasp venom, and Eucrisa [crisaborole]    Review of Systems   Review of Systems  Genitourinary:  Positive for pelvic pain.  Neurological:  Positive for headaches.  All other systems reviewed and are negative.   Physical Exam Updated Vital Signs BP 110/83   Pulse 73   Temp 98.3 F (36.8 C)   Resp 16   Ht 5\' 6"  (1.676 m)   Wt 81.6 kg   SpO2 100%   BMI 29.05 kg/m  Physical Exam Vitals and nursing note reviewed. Exam conducted with a chaperone present (Faith, NT).  Constitutional:      General: She is not in acute distress.    Appearance: Normal appearance. She is well-developed. She is not  ill-appearing, toxic-appearing or diaphoretic.     Comments: Resting comfortably in bed  HENT:     Head: Normocephalic and atraumatic.  Eyes:     Conjunctiva/sclera: Conjunctivae normal.  Cardiovascular:     Rate and Rhythm: Normal rate and regular rhythm.     Heart sounds: No murmur heard. Pulmonary:     Effort: Pulmonary effort is normal. No respiratory distress.     Breath sounds: Normal breath sounds.  Abdominal:     Palpations: Abdomen is soft.     Tenderness: There is no abdominal tenderness. There is no guarding.  Genitourinary:    Comments: Small amount of white vaginal discharge.  Malodorous.  No vaginal bleeding or punctate cervical lesions.  No adnexal tenderness or fullness bilaterally.  No cervical motion or uterine tenderness.  No vulvar lesions Musculoskeletal:        General: No swelling.     Cervical back: Neck supple.  Skin:    General: Skin is warm and dry.     Capillary Refill: Capillary refill takes less than 2 seconds.  Neurological:     General: No focal deficit present.     Mental Status: She is alert and oriented to person, place, and time.     Comments:   MENTAL STATUS: AAOx3   LANG/SPEECH: Fluent, intact naming, repetition & comprehension   CRANIAL NERVES:   II: Pupils equal and reactive   III, IV, VI: EOM intact, no gaze preference or deviation, no nystagmus   V: normal sensation of the face   VII: no facial asymmetry   VIII: normal hearing to speech   MOTOR: 5/5 in both upper and lower extremities   SENSORY: Normal to touch in all extremiteis   COORD: Normal finger to nose, heel to shin and shoulder shrug, no tremor, no dysmetria. No pronator drift   Psychiatric:        Mood and Affect: Mood normal.        Behavior: Behavior normal.     ED Results / Procedures / Treatments   Labs (all labs ordered are listed, but only abnormal results are displayed) Labs Reviewed  WET PREP, GENITAL - Abnormal; Notable for the following components:       Result Value   Clue Cells Wet Prep HPF POC PRESENT (*)    WBC, Wet Prep HPF POC >=10 (*)    All other components within normal limits  URINALYSIS, ROUTINE W REFLEX MICROSCOPIC - Abnormal; Notable for the following components:   APPearance HAZY (*)    Protein, ur 30 (*)    All other components within normal limits  COMPREHENSIVE METABOLIC PANEL - Abnormal; Notable for the following components:   Glucose, Bld 104 (*)  Creatinine, Ser 1.11 (*)    ALT 65 (*)    All other components within normal limits  CBC WITH DIFFERENTIAL/PLATELET  MAGNESIUM  RPR  HIV ANTIBODY (ROUTINE TESTING W REFLEX)  I-STAT BETA HCG BLOOD, ED ([facility], [facility], [facility] ONLY)  GC/CHLAMYDIA PROBE AMP ([place]) NOT AT [facility]    EKG None  Radiology No results found.  Procedures Procedures    Medications Ordered in ED Medications  ketorolac (TORADOL) 30 MG/ML injection 30 mg (30 mg Intravenous Given 09/21/22 2135)  diphenhydrAMINE (BENADRYL) injection 12.5 mg (12.5 mg Intravenous Given 09/21/22 2135)  metoCLOPramide (REGLAN) injection 10 mg (10 mg Intravenous Given 09/21/22 2135)  sodium chloride 0.9 % bolus 1,000 mL (1,000 mLs Intravenous New Bag/Given 09/21/22 2129)    ED Course/ Medical Decision Making/ A&P Clinical Course as of 09/21/22 2231  Sat Sep 21, 2022  2153 Patient reports significant improvement in her headache [AS]    Clinical Course User Index [AS] Lula Olszewski Edsel Petrin, PA-C                             Medical Decision Making Amount and/or Complexity of Data Reviewed Labs: ordered.  Risk Prescription drug management.  This patient presents to the ED for concern of headache, this involves an extensive number of treatment options, and is a complaint that carries with it a high risk of complications and morbidity. Emergent considerations for headache include subarachnoid hemorrhage, meningitis, temporal arteritis, glaucoma, cerebral ischemia, carotid/vertebral dissection, intracranial tumor, Venous  sinus thrombosis, carbon monoxide poisoning, acute or chronic subdural hemorrhage.  Other considerations include: Migraine, Cluster headache, Hypertension, Caffeine, alcohol, or drug withdrawal, Pseudotumor cerebri, Arteriovenous malformation, Head injury, Neurocysticercosis, Post-lumbar puncture, Preeclampsia, Tension headache, Sinusitis, Cervical arthritis, Refractive error causing strain, Dental abscess, Otitis media, Temporomandibular joint syndrome, Depression, Somatoform disorder (eg, somatization) Trigeminal neuralgia, Glossopharyngeal neuralgia.   Co morbidities that complicate the patient evaluation  anxiety, depression, PTSD, PCOS, migraines  My initial workup includes labs, symptom control  Additional history obtained from: Nursing notes from this visit.  I ordered, reviewed and interpreted labs which include: CBC, CMP, magnesium, hCG, STI panel  Afebrile, hemodynamically stable.  22 year old female presenting to the ED for evaluation of a headache and vaginal pain.  She appears very well on physical exam.  She denies any neurologic complaints outside of her headache.  Her neurologic exam is reassuring.  No evidence of meningismus.  I suspect an atypical migraine.  She reported resolution of her symptoms after treatment in the ED.  She is also complaining of some vaginal pain.  This appears chronic in nature, however states that her vaginal discharge has increased lately.  Her wet prep showed clue cells consistent with bacterial vaginosis.  She will be treated with metronidazole for this.  She was encouraged to follow-up with her primary care provider or gynecologist regarding her chronic vaginal pain.  She was given return precautions.  Stable at discharge.  At this time there does not appear to be any evidence of an acute emergency medical condition and the patient appears stable for discharge with appropriate outpatient follow up. Diagnosis was discussed with patient who verbalizes  understanding of care plan and is agreeable to discharge. I have discussed return precautions with patient who verbalizes understanding. Patient encouraged to follow-up with their PCP within 1 week. All questions answered.  Note: Portions of this report may have been transcribed using voice recognition software. Every effort was made to ensure  accuracy; however, inadvertent computerized transcription errors may still be present.        Final Clinical Impression(s) / ED Diagnoses Final diagnoses:  Bad headache  Bacterial vaginosis    Rx / DC Orders ED Discharge Orders          Ordered    metroNIDAZOLE (FLAGYL) 500 MG tablet  2 times daily        09/21/22 2231              Michelle Piper, Cordelia Poche 09/21/22 2231    Virgina Norfolk, DO 09/21/22 2233

## 2022-09-21 NOTE — Discharge Instructions (Signed)
You have been seen today for your complaint of headache, vaginal pain. Your discharge medications include metronidazole. This is an antibiotic. You should take it as prescribed. You should take it for the entire duration of the prescription. This may cause an upset stomach. This is normal. You may take this with food. You may also eat yogurt to prevent diarrhea. Follow up with: Your primary care provider or gynecologist as soon as possible regarding your vaginal pain. Please seek immediate medical care if you develop any of the following symptoms: Your headache: Becomes severe quickly. Gets worse after moderate to intense physical activity. You have any of these symptoms: Repeated vomiting. Pain or stiffness in your neck. Changes to your vision. Pain in an eye or ear. Problems with speech. Muscular weakness or loss of muscle control. Loss of balance or coordination. You feel faint or pass out. You have confusion. You have a seizure. At this time there does not appear to be the presence of an emergent medical condition, however there is always the potential for conditions to change. Please read and follow the below instructions.  Do not take your medicine if  develop an itchy rash, swelling in your mouth or lips, or difficulty breathing; call 911 and seek immediate emergency medical attention if this occurs.  You may review your lab tests and imaging results in their entirety on your MyChart account.  Please discuss all results of fully with your primary care provider and other specialist at your follow-up visit.  Note: Portions of this text may have been transcribed using voice recognition software. Every effort was made to ensure accuracy; however, inadvertent computerized transcription errors may still be present.

## 2022-09-22 LAB — HIV ANTIBODY (ROUTINE TESTING W REFLEX): HIV Screen 4th Generation wRfx: NONREACTIVE

## 2022-09-22 LAB — RPR: RPR Ser Ql: NONREACTIVE

## 2022-09-23 LAB — GC/CHLAMYDIA PROBE AMP (~~LOC~~) NOT AT ARMC
Chlamydia: NEGATIVE
Comment: NEGATIVE
Comment: NORMAL
Neisseria Gonorrhea: NEGATIVE

## 2023-02-18 ENCOUNTER — Other Ambulatory Visit: Payer: Self-pay

## 2023-02-18 ENCOUNTER — Emergency Department (HOSPITAL_COMMUNITY): Payer: Medicaid Other

## 2023-02-18 ENCOUNTER — Encounter (HOSPITAL_COMMUNITY): Payer: Self-pay

## 2023-02-18 ENCOUNTER — Emergency Department (HOSPITAL_COMMUNITY)
Admission: EM | Admit: 2023-02-18 | Discharge: 2023-02-18 | Disposition: A | Discharge status: Home / Self Care | Payer: Medicaid Other | Attending: Emergency Medicine | Admitting: Emergency Medicine

## 2023-02-18 DIAGNOSIS — S61512A Laceration without foreign body of left wrist, initial encounter: Secondary | ICD-10-CM | POA: Insufficient documentation

## 2023-02-18 DIAGNOSIS — W1802XA Striking against glass with subsequent fall, initial encounter: Secondary | ICD-10-CM | POA: Insufficient documentation

## 2023-02-18 MED ORDER — LORAZEPAM 1 MG PO TABS
0.5000 mg | ORAL_TABLET | Freq: Once | ORAL | Status: AC
  Administered 2023-02-18: 0.5 mg via ORAL
  Filled 2023-02-18: qty 1

## 2023-02-18 MED ORDER — LIDOCAINE HCL (PF) 1 % IJ SOLN
10.0000 mL | Freq: Once | INTRAMUSCULAR | Status: AC
  Administered 2023-02-18: 10 mL
  Filled 2023-02-18: qty 10

## 2023-02-18 NOTE — Discharge Instructions (Signed)
Please follow-up with your primary care doctor in 7-10 days, for suture removal.  If the area becomes red, swollen, tender to the touch return to the ER immediately.  Do not get the sutures wet for the first 24 hours.

## 2023-02-18 NOTE — ED Triage Notes (Signed)
Pt punched though glass with left hand last night and cut left wrist. Pt denies SI. Pt has tingling in fingers, is able to move them.

## 2023-02-18 NOTE — ED Notes (Signed)
Dry drg applied over stitches before departure.

## 2023-02-18 NOTE — ED Provider Notes (Signed)
Care assumed from previous provider.  See note for full HPI  In summation 22 year old who punched through a glass yesterday evening.  Laceration was closed by provider, tetanus up-to-date.  No obvious deformity, neurovascular intact.  Plan on follow-up on x-ray imaging, can DC home Physical Exam  BP 109/68   Pulse 67   Temp 98.4 F (36.9 C) (Oral)   Resp 17   Ht 5\' 6"  (1.676 m)   Wt 73.9 kg   LMP 02/18/2023   SpO2 (!) 10%   BMI 26.31 kg/m   Physical Exam Vitals and nursing note reviewed.  Constitutional:      General: She is not in acute distress.    Appearance: She is well-developed. She is not ill-appearing.  HENT:     Head: Atraumatic.  Eyes:     Pupils: Pupils are equal, round, and reactive to light.  Cardiovascular:     Rate and Rhythm: Normal rate.  Pulmonary:     Effort: No respiratory distress.  Abdominal:     General: There is no distension.  Musculoskeletal:        General: Normal range of motion.     Cervical back: Normal range of motion.  Skin:    General: Skin is warm and dry.     Comments: Laceration with #4 sutures to left wrist  Neurological:     General: No focal deficit present.     Mental Status: She is alert.  Psychiatric:        Mood and Affect: Mood normal.     Procedures  Procedures DG Wrist Complete Left  Result Date: 02/18/2023 CLINICAL DATA:  Laceration EXAM: LEFT WRIST - COMPLETE 3+ VIEW COMPARISON:  None Available. FINDINGS: There is no evidence of fracture or dislocation. There is no evidence of arthropathy or other focal bone abnormality. Laceration volar aspect of the wrist. No radiopaque foreign body IMPRESSION: No acute osseous abnormality. Electronically Signed   By: Jasmine Pang M.D.   On: 02/18/2023 16:25    ED Course / MDM   Clinical Course as of 02/18/23 2306  Tue Feb 18, 2023  1521 Punched a window to her hand. Lac completed. Waiting on xray. Tetanus up to date [BH]    Clinical Course User Index [BH] Elisheva Fallas A,  PA-C   Care assumed from previous provider.  See note for full HPI  In summation 22 year old who punched through a glass yesterday evening.  Laceration was closed by provider, tetanus up-to-date.  No obvious deformity, neurovascular intact.  Plan on follow-up on x-ray imaging, can DC home   Medical Decision Making Amount and/or Complexity of Data Reviewed External Data Reviewed: labs, radiology and notes. Radiology: ordered and independent interpretation performed. Decision-making details documented in ED Course.  Risk Prescription drug management.          Demetress Tift A, PA-C 02/18/23 2306    Lorre Nick, MD 02/21/23 1201

## 2023-02-18 NOTE — ED Provider Notes (Signed)
[place] EMERGENCY DEPARTMENT AT [facility] Provider Note   CSN: [id number] Arrival date & time: 02/18/23  1153     History  Chief Complaint  Patient presents with   Extremity Laceration    Elizabeth Lam is a 22 y.o. female, no pertinent past medical history, who presents to the ED secondary to the punching through glass last night secondary to anger.  She states that she has some numbness and tingling of her middle fingers, but otherwise has no difficulty with range of motion.  She states it does hurt a little bit when she bends her wrist, but otherwise has no pain.  Is not on any blood thinners.  Last tetanus was 2 years ago.  No other complaints Home Medications Prior to Admission medications   Medication Sig Start Date End Date Taking? Authorizing Provider  acetaminophen (TYLENOL) 500 MG tablet Take 1,000 mg by mouth daily as needed for moderate pain (pain score 4-6) or headache.   Yes [provider]  norethindrone (MICRONOR) 0.35 MG tablet Take 1 tablet by mouth daily at 6 PM.   Yes [provider]  metroNIDAZOLE (FLAGYL) 500 MG tablet Take 1 tablet (500 mg total) by mouth 2 (two) times daily. Patient not taking: Reported on 02/18/2023 08/02/22   Antony Madura, PA-C  ipratropium (ATROVENT) 0.06 % nasal spray Place 2 sprays into both nostrils 4 (four) times daily. 02/21/19 06/02/19  Belinda Fisher, PA-C  metoCLOPramide (REGLAN) 10 MG tablet Take 1 tablet (10 mg total) by mouth every 6 (six) hours. 02/21/19 06/02/19  Cathie Hoops, Amy V, PA-C  prazosin (MINIPRESS) 2 MG capsule Take 1 capsule (2 mg total) by mouth at bedtime. 11/11/18 06/02/19  Randall Hiss, MD  SRONYX 0.1-20 MG-MCG tablet TAKE ONE TABLET BY MOUTH DAILY FOR 28 DAYS. 12/28/18 06/02/19  [provider]      Allergies    Bee venom, Fire ant, Other, Red dye #40 (allura red), Wasp venom, and Eucrisa [crisaborole]    Review of Systems   Review of Systems  Skin:  Positive for wound.   Neurological:  Positive for numbness.    Physical Exam Updated Vital Signs BP 111/73   Pulse 78   Temp 98.2 F (36.8 C) (Oral)   Resp 19   Ht 5\' 6"  (1.676 m)   Wt 73.9 kg   LMP 02/18/2023   SpO2 100%   BMI 26.31 kg/m  Physical Exam Vitals and nursing note reviewed.  Constitutional:      General: She is not in acute distress.    Appearance: She is well-developed.  HENT:     Head: Normocephalic and atraumatic.  Eyes:     General:        Right eye: No discharge.        Left eye: No discharge.     Conjunctiva/sclera: Conjunctivae normal.  Pulmonary:     Effort: No respiratory distress.  Musculoskeletal:     Comments: Left hand: TTP of distal radius along laceration line. 2 cm laceration to anterior wrist. Radial pulses present. Grip strength intact. Able to flex, extend, ulnar and radial deviate wrist. Two point discrimination intact. Normal thumb opposition. Intact ROM for all MCPs, PIPs, and DIPs.  No snuffbox ttp. Paresthesias to digits 2-4. Capillary refill <2sec   Neurological:     Mental Status: She is alert.     Comments: Clear speech.   Psychiatric:        Behavior: Behavior normal.  Thought Content: Thought content normal.     ED Results / Procedures / Treatments   Labs (all labs ordered are listed, but only abnormal results are displayed) Labs Reviewed - No data to display  EKG None  Radiology No results found.  Procedures .Marland KitchenLaceration Repair  Date/Time: 02/18/2023 2:23 PM  Performed by: Pete Pelt, PA Authorized by: Pete Pelt, PA   Consent:    Consent obtained:  Verbal   Consent given by:  Patient   Risks, benefits, and alternatives were discussed: yes     Risks discussed:  Infection, nerve damage, need for additional repair, poor wound healing, poor cosmetic result, pain, retained foreign body, tendon damage and vascular damage   Alternatives discussed:  No treatment Universal protocol:    Patient identity confirmed:   Verbally with patient Anesthesia:    Anesthesia method:  Local infiltration   Local anesthetic:  Lidocaine 1% w/o epi Laceration details:    Location:  Shoulder/arm   Shoulder/arm location:  L lower arm   Length (cm):  2 Treatment:    Area cleansed with:  Povidone-iodine   Amount of cleaning:  Standard   Irrigation solution:  Sterile saline   Irrigation method:  Syringe Skin repair:    Repair method:  Sutures   Suture size:  4-0   Suture material:  Prolene   Suture technique:  Simple interrupted   Number of sutures:  4 Approximation:    Approximation:  Close Repair type:    Repair type:  Simple Post-procedure details:    Dressing:  Non-adherent dressing   Procedure completion:  Tolerated     Medications Ordered in ED Medications  lidocaine (PF) (XYLOCAINE) 1 % injection 10 mL (10 mLs Other Given by Other 02/18/23 1415)  LORazepam (ATIVAN) tablet 0.5 mg (0.5 mg Oral Given 02/18/23 1354)    ED Course/ Medical Decision Making/ A&P Clinical Course as of 02/18/23 1528  Tue Feb 18, 2023  1521 Punched a window to her hand. Lac completed. Waiting on xray. Tetanus up to date [BH]    Clinical Course User Index [BH] Henderly, Britni A, PA-C                                 Medical Decision Making Patient is a 22 year old female, who punched glass last night.  She states she has pain around the wound, but no other pain.  Tetanus last completed 2 years ago.  She has no snuffbox tenderness, has slight tenderness to the wrist, we will obtain x-rays, for further evaluation, and I cleansed the wound, and repaired it with 4 sutures.  Good hemostasis.  Pending x-ray at this time, [place] PA, to follow-up on results.    Amount and/or Complexity of Data Reviewed Radiology: ordered.  Risk Prescription drug management.    Final Clinical Impression(s) / ED Diagnoses Final diagnoses:  Laceration of left wrist, initial encounter    Rx / DC Orders ED Discharge Orders     None          Calea Hribar, Harley [place], PA 02/18/23 1528    Virgina Norfolk, DO 02/18/23 1550

## 2023-03-01 ENCOUNTER — Encounter (HOSPITAL_COMMUNITY): Payer: Self-pay | Admitting: *Deleted

## 2023-03-01 ENCOUNTER — Emergency Department (HOSPITAL_COMMUNITY)
Admission: EM | Admit: 2023-03-01 | Discharge: 2023-03-01 | Disposition: A | Discharge status: Home / Self Care | Payer: Medicaid - Out of State | Attending: Emergency Medicine | Admitting: Emergency Medicine

## 2023-03-01 ENCOUNTER — Other Ambulatory Visit: Payer: Self-pay

## 2023-03-01 DIAGNOSIS — Z4802 Encounter for removal of sutures: Secondary | ICD-10-CM | POA: Insufficient documentation

## 2023-03-01 DIAGNOSIS — S61512D Laceration without foreign body of left wrist, subsequent encounter: Secondary | ICD-10-CM | POA: Diagnosis present

## 2023-03-01 DIAGNOSIS — W25XXXD Contact with sharp glass, subsequent encounter: Secondary | ICD-10-CM | POA: Diagnosis not present

## 2023-03-01 NOTE — Discharge Instructions (Addendum)
Contact a health care provider if: You have more redness, swelling, or pain around your wound. You have fluid or blood coming from your wound. You have new warmth, a rash, or hardness at the wound site. You have pus or a bad smell coming from your wound. Your wound opens up. Get help right away if: You have a fever or chills. You have red streaks coming from your wound.

## 2023-03-01 NOTE — ED Provider Notes (Signed)
[place] EMERGENCY DEPARTMENT AT [facility] Provider Note   CSN: [id number] Arrival date & time: 03/01/23  1535     History  Chief Complaint  Patient presents with   Wound Check    Elizabeth Lam is a 22 y.o. female who presents emergency department chief complaint of sutures in the left wrist.  She was seen 11 days ago for laceration to the left wrist.  Current when she was cut by glass at her job.  She is having some sharp, stinging pain that radiates into her hand and feels like her grip strength is weak since this laceration occurred.  He has not been taking any medications and does not have a wrist splint.   Wound Check       Home Medications Prior to Admission medications   Medication Sig Start Date End Date Taking? Authorizing Provider  acetaminophen (TYLENOL) 500 MG tablet Take 1,000 mg by mouth daily as needed for moderate pain (pain score 4-6) or headache.    [provider]  metroNIDAZOLE (FLAGYL) 500 MG tablet Take 1 tablet (500 mg total) by mouth 2 (two) times daily. Patient not taking: Reported on 02/18/2023 08/02/22   Antony Madura, PA-C  norethindrone (MICRONOR) 0.35 MG tablet Take 1 tablet by mouth daily at 6 PM.    [provider]  ipratropium (ATROVENT) 0.06 % nasal spray Place 2 sprays into both nostrils 4 (four) times daily. 02/21/19 06/02/19  Belinda Fisher, PA-C  metoCLOPramide (REGLAN) 10 MG tablet Take 1 tablet (10 mg total) by mouth every 6 (six) hours. 02/21/19 06/02/19  Cathie Hoops, Amy V, PA-C  prazosin (MINIPRESS) 2 MG capsule Take 1 capsule (2 mg total) by mouth at bedtime. 11/11/18 06/02/19  Randall Hiss, MD  SRONYX 0.1-20 MG-MCG tablet TAKE ONE TABLET BY MOUTH DAILY FOR 28 DAYS. 12/28/18 06/02/19  [provider]      Allergies    Bee venom, Fire ant, Other, Red dye #40 (allura red), Wasp venom, and Eucrisa [crisaborole]    Review of Systems   Review of Systems  Physical Exam Updated Vital Signs BP 129/76 (BP  Location: Right Arm)   Pulse 92   Temp 98.3 F (36.8 C) (Oral)   Resp 14   Ht 5\' 6"  (1.676 m)   Wt 73.9 kg   LMP 03/01/2023   SpO2 99%   BMI 26.30 kg/m  Physical Exam Vitals and nursing note reviewed.  Constitutional:      General: She is not in acute distress.    Appearance: She is well-developed. She is not diaphoretic.  HENT:     Head: Normocephalic and atraumatic.     Right Ear: External ear normal.     Left Ear: External ear normal.     Nose: Nose normal.     Mouth/Throat:     Mouth: Mucous membranes are moist.  Eyes:     General: No scleral icterus.    Conjunctiva/sclera: Conjunctivae normal.  Cardiovascular:     Rate and Rhythm: Normal rate and regular rhythm.     Heart sounds: Normal heart sounds. No murmur heard.    No friction rub. No gallop.  Pulmonary:     Effort: Pulmonary effort is normal. No respiratory distress.     Breath sounds: Normal breath sounds.  Abdominal:     General: Bowel sounds are normal. There is no distension.     Palpations: Abdomen is soft. There is no mass.     Tenderness: There is no  abdominal tenderness. There is no guarding.  Musculoskeletal:     Cervical back: Normal range of motion.     Comments: 4 sutures placed in the left volar wrist with good healing, no active drainage.  Skin:    General: Skin is warm and dry.  Neurological:     Mental Status: She is alert and oriented to person, place, and time.  Psychiatric:        Behavior: Behavior normal.     ED Results / Procedures / Treatments   Labs (all labs ordered are listed, but only abnormal results are displayed) Labs Reviewed - No data to display  EKG None  Radiology No results found.  Procedures .Suture Removal  Date/Time: 03/01/2023 4:32 PM  Performed by: Arthor Captain, PA-C Authorized by: Arthor Captain, PA-C   Consent:    Consent obtained:  Verbal   Risks discussed:  Bleeding, pain and wound separation   Alternatives discussed:  No  treatment Universal protocol:    Patient identity confirmed:  Arm band Location:    Location:  Upper extremity   Upper extremity location:  Wrist   Wrist location:  L wrist Procedure details:    Wound appearance:  No signs of infection, good wound healing and clean   Number of sutures removed:  4 Post-procedure details:    Post-removal:  Band-Aid applied   Procedure completion:  Tolerated well, no immediate complications     Medications Ordered in ED Medications - No data to display  ED Course/ Medical Decision Making/ A&P                                 Medical Decision Making  Patient here for suture removal.  Wound is well-healing.  Complaining of some grip strength weakness and pinching pain in the hand.  Will provide a Velcro wrist brace and follow-up with hand specialist.  Appropriate for discharge at this time        Final Clinical Impression(s) / ED Diagnoses Final diagnoses:  None    Rx / DC Orders ED Discharge Orders     None         Arthor Captain, PA-C 03/01/23 1634    Wynetta Fines, MD 03/01/23 [phone]

## 2023-03-01 NOTE — ED Triage Notes (Signed)
The pt is here sutures in her lt wrist from 11-11  she has had some sttange pain in that area and her grip is not working as normal

## 2023-06-04 ENCOUNTER — Ambulatory Visit
Admission: EM | Admit: 2023-06-04 | Discharge: 2023-06-04 | Disposition: A | Discharge status: Home / Self Care | Payer: MEDICAID | Attending: Family Medicine | Admitting: Family Medicine

## 2023-06-04 ENCOUNTER — Other Ambulatory Visit: Payer: Self-pay

## 2023-06-04 DIAGNOSIS — R112 Nausea with vomiting, unspecified: Secondary | ICD-10-CM

## 2023-06-04 DIAGNOSIS — Z20822 Contact with and (suspected) exposure to covid-19: Secondary | ICD-10-CM | POA: Diagnosis not present

## 2023-06-04 DIAGNOSIS — B349 Viral infection, unspecified: Secondary | ICD-10-CM

## 2023-06-04 LAB — POC COVID19/FLU A&B COMBO
Covid Antigen, POC: NEGATIVE
Influenza A Antigen, POC: NEGATIVE
Influenza B Antigen, POC: NEGATIVE

## 2023-06-04 MED ORDER — ONDANSETRON HCL 4 MG PO TABS: 4.0000 mg | ORAL_TABLET | Freq: Three times a day (TID) | ORAL | 0 refills | Status: DC | PRN

## 2023-06-04 NOTE — ED Provider Notes (Signed)
 [name]    CSN: [id number] Arrival date & time: 06/04/23  1017      History   Chief Complaint Chief Complaint  Patient presents with   Emesis    HPI Elizabeth Lam is a 23 y.o. female.  Patient here today with viral symptoms of nausea, vomiting, cough, hoarseness of voice and soreness of throat from coughing.  Reports she was sent home from work due to symptoms.  Patient also complains of a generalized headache.  Patient recently exposed to COVID.  Past Medical History:  Diagnosis Date   Anxiety    COVID-19 virus infection 04/2020   Depression    Difficulty sleeping    Morbid obesity (HCC)    OCD (obsessive compulsive disorder)    PCOS (polycystic ovarian syndrome)    Psychogenic nonepileptic seizure    PTSD (post-traumatic stress disorder)    Vaso vagal episode 2021    Patient Active Problem List   Diagnosis Date Noted   Morbid obesity (HCC)    COVID-19 virus infection 04/2020   Vaso vagal episode 2021   Psychogenic nonepileptic seizure 02/16/2019   Insomnia 11/20/2018   PCOS (polycystic ovarian syndrome) 11/20/2018   Vasovagal episode 10/30/2018   Seizure-like activity (HCC) 10/30/2018   Anxiety state 10/30/2018   MDD (major depressive disorder), recurrent severe, without psychosis (HCC) 06/02/2018   Chronic post-traumatic stress disorder (PTSD) 06/02/2018   Suicide ideation 06/02/2018   Self-injurious behavior 06/02/2018   Dislocation of right patella 01/06/2017   Headache 02/01/2016    Past Surgical History:  Procedure Laterality Date   DENTAL SURGERY     TONSILLECTOMY      OB History     Gravida  1   Para      Term      Preterm      AB      Living         SAB      IAB      Ectopic      Multiple      Live Births               Home Medications    Prior to Admission medications   Medication Sig Start Date End Date Taking? Authorizing Provider  acetaminophen (TYLENOL) 500 MG tablet Take 1,000 mg by mouth  daily as needed for moderate pain (pain score 4-6) or headache.   Yes [provider]  ondansetron (ZOFRAN) 4 MG tablet Take 1 tablet (4 mg total) by mouth every 8 (eight) hours as needed for nausea or vomiting. 06/04/23  Yes Bing Neighbors, NP  metroNIDAZOLE (FLAGYL) 500 MG tablet Take 1 tablet (500 mg total) by mouth 2 (two) times daily. Patient not taking: Reported on 02/18/2023 08/02/22   Antony Madura, PA-C  norethindrone [facility]) 0.35 MG tablet Take 1 tablet by mouth daily at 6 PM. Patient not taking: Reported on 06/04/2023    [provider]  ipratropium (ATROVENT) 0.06 % nasal spray Place 2 sprays into both nostrils 4 (four) times daily. 02/21/19 06/02/19  Belinda Fisher, PA-C  metoCLOPramide (REGLAN) 10 MG tablet Take 1 tablet (10 mg total) by mouth every 6 (six) hours. 02/21/19 06/02/19  Cathie Hoops, Amy V, PA-C  prazosin (MINIPRESS) 2 MG capsule Take 1 capsule (2 mg total) by mouth at bedtime. 11/11/18 06/02/19  Randall Hiss, MD  SRONYX 0.1-20 MG-MCG tablet TAKE ONE TABLET BY MOUTH DAILY FOR 28 DAYS. 12/28/18 06/02/19  [provider]    Family  History Family History  Problem Relation Age of Onset   Arrhythmia Mother        [place], has ICD   Healthy Father     Social History Social History   Tobacco Use   Smoking status: Never    Passive exposure: Past   Smokeless tobacco: Never  Vaping Use   Vaping status: Some Days   Substances: Nicotine, Flavoring  Substance Use Topics   Alcohol use: Never   Drug use: Yes    Frequency: 2.0 times per week    Types: Marijuana     Allergies   Bee venom, Fire ant, Other, Red dye #40 (allura red), Wasp venom, and Eucrisa [crisaborole]   Review of Systems Review of Systems  Gastrointestinal:  Positive for vomiting.     Physical Exam Triage Vital Signs ED Triage Vitals  Encounter Vitals Group     BP 06/04/23 1105 123/62     Systolic BP Percentile --      Diastolic BP Percentile --      Pulse Rate 06/04/23 1105  75     Resp 06/04/23 1105 18     Temp 06/04/23 1105 98.7 F (37.1 C)     Temp Source 06/04/23 1105 Oral     SpO2 06/04/23 1105 96 %     Weight --      Height --      Head Circumference --      Peak Flow --      Pain Score 06/04/23 1102 6     Pain Loc --      Pain Education --      Exclude from Growth Chart --    No data found.  Updated Vital Signs BP 123/62 (BP Location: Right Arm)   Pulse 75   Temp 98.7 F (37.1 C) (Oral)   Resp 18   SpO2 96%   Visual Acuity Right Eye Distance:   Left Eye Distance:   Bilateral Distance:    Right Eye Near:   Left Eye Near:    Bilateral Near:     Physical Exam Vitals and nursing note reviewed.  Constitutional:      Appearance: Normal appearance.  HENT:     Head: Normocephalic and atraumatic.     Nose: Nose normal.  Eyes:     Extraocular Movements: Extraocular movements intact.     Pupils: Pupils are equal, round, and reactive to light.  Cardiovascular:     Rate and Rhythm: Normal rate and regular rhythm.  Pulmonary:     Effort: Pulmonary effort is normal.     Breath sounds: Normal breath sounds.  Abdominal:     General: Abdomen is flat. There is no distension.  Musculoskeletal:        General: Normal range of motion.  Skin:    General: Skin is warm and dry.  Neurological:     General: No focal deficit present.     Mental Status: She is alert.      UC Treatments / Results  Labs (all labs ordered are listed, but only abnormal results are displayed) Labs Reviewed  POC COVID19/FLU A&B COMBO - Normal    EKG   Radiology No results found.  Procedures Procedures (including critical care time)  Medications Ordered in UC Medications - No data to display  Initial Impression / Assessment and Plan / UC Course  I have reviewed the triage vital signs and the nursing notes.  Pertinent labs & imaging results that were available during my  care of the patient were reviewed by me and considered in my medical decision  making (see chart for details).    COVID/flu combo test is negative.  Patient likely has a viral illness based on clinical presentation.  Signs are stable which is reassuring.  Patient is experiencing some nausea prescribe Zofran.  Return precautions given if symptoms worsen or do not improve. Final Clinical Impressions(s) / UC Diagnoses   Final diagnoses:  Viral illness  Close exposure to COVID-19 virus  Nausea and vomiting, unspecified vomiting type   Discharge Instructions   None    ED Prescriptions     Medication Sig Dispense Auth. Provider   ondansetron (ZOFRAN) 4 MG tablet Take 1 tablet (4 mg total) by mouth every 8 (eight) hours as needed for nausea or vomiting. 20 tablet Bing Neighbors, NP      PDMP not reviewed this encounter.   Bing Neighbors, NP 06/04/23 [phone]

## 2023-06-04 NOTE — ED Triage Notes (Signed)
 Pt reports exposure to covid at work- Last 4 days c/o "feels weak", vomiting, cough, lost voice. States throat "raw" from coughing. 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 C/o headache also. No meds today

## 2023-06-27 ENCOUNTER — Emergency Department (HOSPITAL_COMMUNITY): Payer: MEDICAID

## 2023-06-27 ENCOUNTER — Emergency Department (HOSPITAL_COMMUNITY)
Admission: EM | Admit: 2023-06-27 | Discharge: 2023-06-27 | Disposition: A | Discharge status: Home / Self Care | Payer: MEDICAID | Attending: Emergency Medicine | Admitting: Emergency Medicine

## 2023-06-27 ENCOUNTER — Other Ambulatory Visit: Payer: Self-pay

## 2023-06-27 ENCOUNTER — Encounter (HOSPITAL_COMMUNITY): Payer: Self-pay | Admitting: *Deleted

## 2023-06-27 DIAGNOSIS — R000 Tachycardia, unspecified: Secondary | ICD-10-CM | POA: Diagnosis not present

## 2023-06-27 DIAGNOSIS — R0789 Other chest pain: Secondary | ICD-10-CM

## 2023-06-27 DIAGNOSIS — R079 Chest pain, unspecified: Secondary | ICD-10-CM | POA: Diagnosis present

## 2023-06-27 DIAGNOSIS — R002 Palpitations: Secondary | ICD-10-CM

## 2023-06-27 LAB — CBC
HCT: 41.5 % (ref 36.0–46.0)
Hemoglobin: 13.6 g/dL (ref 12.0–15.0)
MCH: 30.5 pg (ref 26.0–34.0)
MCHC: 32.8 g/dL (ref 30.0–36.0)
MCV: 93 fL (ref 80.0–100.0)
Platelets: 280 10*3/uL (ref 150–400)
RBC: 4.46 MIL/uL (ref 3.87–5.11)
RDW: 12.8 % (ref 11.5–15.5)
WBC: 6.8 10*3/uL (ref 4.0–10.5)
nRBC: 0 % (ref 0.0–0.2)

## 2023-06-27 LAB — BASIC METABOLIC PANEL
Anion gap: 7 (ref 5–15)
BUN: 6 mg/dL (ref 6–20)
CO2: 27 mmol/L (ref 22–32)
Calcium: 9.6 mg/dL (ref 8.9–10.3)
Chloride: 107 mmol/L (ref 98–111)
Creatinine, Ser: 1.01 mg/dL — ABNORMAL HIGH (ref 0.44–1.00)
GFR, Estimated: >60 mL/min (ref >60)
Glucose, Bld: 96 mg/dL (ref 70–99)
Potassium: 4.2 mmol/L (ref 3.5–5.1)
Sodium: 141 mmol/L (ref 135–145)

## 2023-06-27 LAB — TROPONIN I (HIGH SENSITIVITY): Troponin I (High Sensitivity): <2 ng/L (ref <18)

## 2023-06-27 LAB — D-DIMER, QUANTITATIVE: D-Dimer, Quant: <0.27 ug{FEU}/mL (ref 0.00–0.50)

## 2023-06-27 LAB — HCG, SERUM, QUALITATIVE: Preg, Serum: NEGATIVE

## 2023-06-27 MED ORDER — FAMOTIDINE 20 MG PO TABS
20.0000 mg | ORAL_TABLET | Freq: Once | ORAL | Status: AC
  Administered 2023-06-27: 20 mg via ORAL
  Filled 2023-06-27: qty 1

## 2023-06-27 MED ORDER — KETOROLAC TROMETHAMINE 30 MG/ML IJ SOLN
30.0000 mg | Freq: Once | INTRAMUSCULAR | Status: AC
  Administered 2023-06-27: 30 mg via INTRAVENOUS
  Filled 2023-06-27: qty 1

## 2023-06-27 MED ORDER — ACETAMINOPHEN 325 MG PO TABS
650.0000 mg | ORAL_TABLET | Freq: Once | ORAL | Status: AC
  Administered 2023-06-27: 650 mg via ORAL
  Filled 2023-06-27: qty 2

## 2023-06-27 MED ORDER — SODIUM CHLORIDE 0.9 % IV BOLUS
1000.0000 mL | Freq: Once | INTRAVENOUS | Status: AC
  Administered 2023-06-27: 1000 mL via INTRAVENOUS

## 2023-06-27 MED ORDER — ALUM & MAG HYDROXIDE-SIMETH 200-200-20 MG/5ML PO SUSP
30.0000 mL | Freq: Once | ORAL | Status: AC
  Administered 2023-06-27: 30 mL via ORAL
  Filled 2023-06-27: qty 30

## 2023-06-27 NOTE — ED Triage Notes (Signed)
 The pt is c/o chest pain for  4 days she has also had dizziness  rapid heart rate  2 years ago she wore a monitor but has not followed up with this.    Lmp none she has pcos

## 2023-06-27 NOTE — ED Provider Notes (Signed)
 [place] EMERGENCY DEPARTMENT AT [facility] Provider Note   CSN: [id number] Arrival date & time: 06/27/23  1523     History  Chief Complaint  Patient presents with   Chest Pain    Elizabeth Lam is a 23 y.o. female.  Pt is a 23 yo female with pmhx significant for PCOS, anxiety, OCD, and PTSD.  Pt has been having cp for the past 4 days.  She feels like her HR is going up at times.  Pt has had this problem in the past and wore a monitor.  She is not sure what they told her she had.  I can see where the orders were placed for the monitor, but no results.         Home Medications Prior to Admission medications   Medication Sig Start Date End Date Taking? Authorizing Provider  acetaminophen (TYLENOL) 500 MG tablet Take 1,000 mg by mouth daily as needed for moderate pain (pain score 4-6) or headache.    [provider]  metroNIDAZOLE (FLAGYL) 500 MG tablet Take 1 tablet (500 mg total) by mouth 2 (two) times daily. Patient not taking: Reported on 02/18/2023 08/02/22   Antony Madura, PA-C  norethindrone [facility]) 0.35 MG tablet Take 1 tablet by mouth daily at 6 PM. Patient not taking: Reported on 06/04/2023    [provider]  ondansetron (ZOFRAN) 4 MG tablet Take 1 tablet (4 mg total) by mouth every 8 (eight) hours as needed for nausea or vomiting. 06/04/23   Bing Neighbors, NP  ipratropium (ATROVENT) 0.06 % nasal spray Place 2 sprays into both nostrils 4 (four) times daily. 02/21/19 06/02/19  Belinda Fisher, PA-C  metoCLOPramide (REGLAN) 10 MG tablet Take 1 tablet (10 mg total) by mouth every 6 (six) hours. 02/21/19 06/02/19  Cathie Hoops, Amy V, PA-C  prazosin (MINIPRESS) 2 MG capsule Take 1 capsule (2 mg total) by mouth at bedtime. 11/11/18 06/02/19  Randall Hiss, MD  SRONYX 0.1-20 MG-MCG tablet TAKE ONE TABLET BY MOUTH DAILY FOR 28 DAYS. 12/28/18 06/02/19  [provider]      Allergies    Bee venom, Fire ant, Other, Red dye #40 (allura red), Wasp venom,  and Eucrisa [crisaborole]    Review of Systems   Review of Systems  Cardiovascular:  Positive for chest pain and palpitations.  All other systems reviewed and are negative.   Physical Exam Updated Vital Signs BP 128/82   Pulse 98   Temp 98.8 F (37.1 C)   Resp 16   Ht 5\' 6"  (1.676 m)   Wt 73.9 kg   SpO2 98%   BMI 26.30 kg/m  Physical Exam Vitals and nursing note reviewed.  Constitutional:      Appearance: She is well-developed.  HENT:     Head: Normocephalic and atraumatic.  Eyes:     Extraocular Movements: Extraocular movements intact.     Pupils: Pupils are equal, round, and reactive to light.  Cardiovascular:     Rate and Rhythm: Regular rhythm. Tachycardia present.     Heart sounds: Normal heart sounds.  Pulmonary:     Effort: Pulmonary effort is normal.     Breath sounds: Normal breath sounds.  Abdominal:     General: Bowel sounds are normal.     Palpations: Abdomen is soft.  Musculoskeletal:        General: Normal range of motion.     Cervical back: Normal range of motion and neck supple.  Skin:  General: Skin is warm.     Capillary Refill: Capillary refill takes less than 2 seconds.  Neurological:     General: No focal deficit present.     Mental Status: She is alert and oriented to person, place, and time.  Psychiatric:        Mood and Affect: Mood normal.        Behavior: Behavior normal.     ED Results / Procedures / Treatments   Labs (all labs ordered are listed, but only abnormal results are displayed) Labs Reviewed  BASIC METABOLIC PANEL - Abnormal; Notable for the following components:      Result Value   Creatinine, Ser 1.01 (*)    All other components within normal limits  CBC  D-DIMER, QUANTITATIVE  HCG, SERUM, QUALITATIVE  TROPONIN I (HIGH SENSITIVITY)    EKG EKG Interpretation Date/Time:  Friday June 27 2023 16:13:13 EDT Ventricular Rate:  102 PR Interval:  118 QRS Duration:  70 QT Interval:  332 QTC Calculation: 432 R  Axis:   92  Text Interpretation: Sinus tachycardia Right atrial enlargement Rightward axis Pulmonary disease pattern Abnormal ECG When compared with ECG of 08-Feb-2022 23:03, PREVIOUS ECG IS PRESENT No significant change since last tracing Confirmed by Jacalyn Lefevre [phone]) on 06/27/2023 5:37:26 PM  Radiology DG Chest 2 View Result Date: 06/27/2023 CLINICAL DATA:  Pain Pt complains of chest pain in past four days. Was all day yesterday, at rest. Today present for 4 hours. Not pleuritic. Not exertional. No sob, nv or diaphoresis. EXAM: CHEST - 2 VIEW COMPARISON:  Chest x-ray 02/09/2022 FINDINGS: The heart and mediastinal contours are within normal limits. No focal consolidation. No pulmonary edema. No pleural effusion. No pneumothorax. No acute osseous abnormality. IMPRESSION: No active cardiopulmonary disease. Electronically Signed   By: Tish Frederickson M.D.   On: 06/27/2023 17:48    Procedures Procedures    Medications Ordered in ED Medications  alum & mag hydroxide-simeth (MAALOX/MYLANTA) 200-200-20 MG/5ML suspension 30 mL (30 mLs Oral Given 06/27/23 1612)  famotidine (PEPCID) tablet 20 mg (20 mg Oral Given 06/27/23 1611)  acetaminophen (TYLENOL) tablet 650 mg (650 mg Oral Given 06/27/23 1611)  sodium chloride 0.9 % bolus 1,000 mL (1,000 mLs Intravenous New Bag/Given 06/27/23 1800)  ketorolac (TORADOL) 30 MG/ML injection 30 mg (30 mg Intravenous Given 06/27/23 1836)    ED Course/ Medical Decision Making/ A&P                                 Medical Decision Making Amount and/or Complexity of Data Reviewed Labs: ordered.  Risk Prescription drug management.   This patient presents to the ED for concern of cp, this involves an extensive number of treatment options, and is a complaint that carries with it a high risk of complications and morbidity.  The differential diagnosis includes cardiac, pulm, gi, msk   Co morbidities that complicate the patient evaluation  PCOS, anxiety, OCD,  and PTSD   Additional history obtained:  Additional history obtained from epic chart review  Lab Tests:  I Ordered, and personally interpreted labs.  The pertinent results include:  cbc nl, bmp nl, preg neg, trop neg, ddimer neg   Imaging Studies ordered:  I ordered imaging studies including cxr  I independently visualized and interpreted imaging which showed No active cardiopulmonary disease.  I agree with the radiologist interpretation   Cardiac Monitoring:  The patient was maintained on a cardiac  monitor.  I personally viewed and interpreted the cardiac monitored which showed an underlying rhythm of: nsr   Medicines ordered and prescription drug management:  I ordered medication including ivfs  for sx  Reevaluation of the patient after these medicines showed that the patient improved I have reviewed the patients home medicines and have made adjustments as needed   Test Considered:  ct   Problem List / ED Course:  Palpitations:  work up neg.  Pt is stable for d/c.  She is to f/u with cards.  Return if worse.   Reevaluation:  After the interventions noted above, I reevaluated the patient and found that they have :improved   Social Determinants of Health:  Lives at home   Dispostion:  After consideration of the diagnostic results and the patients response to treatment, I feel that the patent would benefit from discharge with outpatient f.u.          Final Clinical Impression(s) / ED Diagnoses Final diagnoses:  Sinus tachycardia  Atypical chest pain  Palpitations    Rx / DC Orders ED Discharge Orders          Ordered    Ambulatory referral to Cardiology        06/27/23 1911              Jacalyn Lefevre, MD 06/27/23 [phone]

## 2023-06-27 NOTE — ED Provider Triage Note (Signed)
 Emergency Medicine Provider Triage Evaluation Note  Elizabeth Lam , a 23 y.o. female  was evaluated in triage.  Pt complains of chest pain in past four days. Was all day yesterday, at rest. Today present for 4 hours. Not pleuritic. Not exertional. No sob, nv or diaphoresis. At times feels as if heart beating fast. No syncope. No leg pain or swelling. Non smoker. No heavy caffeine use or any stimulant use. No heat intolerance, sweats or wt loss.   Review of Systems  Positive: Cp.  Negative: Fevers.   Physical Exam  There were no vitals taken for this visit. Gen:   Awake, no distress   Resp:  Normal effort cta bil. rrr MSK:   Moves extremities without difficulty no leg/calf pain or swelling.    Medical Decision Making  Medically screening exam initiated at 3:41 PM.  Appropriate orders placed.  Elizabeth Lam was informed that the remainder of the evaluation will be completed by another provider, this initial triage assessment does not replace that evaluation, and the importance of remaining in the ED until their evaluation is complete.  Labs and imaging ordered.      Elizabeth Laine, MD 06/27/23 [phone]

## 2023-07-16 ENCOUNTER — Other Ambulatory Visit (HOSPITAL_COMMUNITY)
Admission: RE | Admit: 2023-07-16 | Discharge: 2023-07-16 | Disposition: A | Discharge status: Home / Self Care | Payer: MEDICAID | Source: Ambulatory Visit | Attending: Family Medicine | Admitting: Family Medicine

## 2023-07-16 ENCOUNTER — Encounter: Payer: Self-pay | Admitting: Obstetrics and Gynecology

## 2023-07-16 ENCOUNTER — Ambulatory Visit (INDEPENDENT_AMBULATORY_CARE_PROVIDER_SITE_OTHER): Payer: MEDICAID | Admitting: Obstetrics and Gynecology

## 2023-07-16 ENCOUNTER — Other Ambulatory Visit: Payer: Self-pay

## 2023-07-16 VITALS — BP 136/81 | HR 73 | Wt 168.6 lb

## 2023-07-16 DIAGNOSIS — N915 Oligomenorrhea, unspecified: Secondary | ICD-10-CM

## 2023-07-16 DIAGNOSIS — E282 Polycystic ovarian syndrome: Secondary | ICD-10-CM

## 2023-07-16 DIAGNOSIS — N911 Secondary amenorrhea: Secondary | ICD-10-CM

## 2023-07-16 DIAGNOSIS — Z124 Encounter for screening for malignant neoplasm of cervix: Secondary | ICD-10-CM | POA: Insufficient documentation

## 2023-07-16 DIAGNOSIS — N970 Female infertility associated with anovulation: Secondary | ICD-10-CM | POA: Diagnosis not present

## 2023-07-16 DIAGNOSIS — Z202 Contact with and (suspected) exposure to infections with a predominantly sexual mode of transmission: Secondary | ICD-10-CM

## 2023-07-16 DIAGNOSIS — I10 Essential (primary) hypertension: Secondary | ICD-10-CM

## 2023-07-16 MED ORDER — THERA VITAL M PO TABS: 1.0000 | ORAL_TABLET | Freq: Every day | ORAL | Status: AC
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
## 2023-07-17 LAB — LIPID PANEL
Chol/HDL Ratio: 2.3 ratio (ref 0.0–4.4)
Cholesterol, Total: 173 mg/dL (ref 100–199)
HDL: 75 mg/dL (ref >39)
LDL Chol Calc (NIH): 90 mg/dL (ref 0–99)
Triglycerides: 39 mg/dL (ref 0–149)
VLDL Cholesterol Cal: 8 mg/dL (ref 5–40)

## 2023-07-17 LAB — TSH RFX ON ABNORMAL TO FREE T4: TSH: 0.76 u[IU]/mL (ref 0.450–4.500)

## 2023-07-17 LAB — COMPREHENSIVE METABOLIC PANEL WITH GFR
ALT: 18 IU/L (ref 0–32)
AST: 20 IU/L (ref 0–40)
Albumin: 4.7 g/dL (ref 4.0–5.0)
Alkaline Phosphatase: 46 IU/L (ref 44–121)
BUN/Creatinine Ratio: 11 (ref 9–23)
BUN: 9 mg/dL (ref 6–20)
Bilirubin Total: 0.3 mg/dL (ref 0.0–1.2)
CO2: 24 mmol/L (ref 20–29)
Calcium: 10.1 mg/dL (ref 8.7–10.2)
Chloride: 102 mmol/L (ref 96–106)
Creatinine, Ser: 0.84 mg/dL (ref 0.57–1.00)
Globulin, Total: 2.3 g/dL (ref 1.5–4.5)
Glucose: 103 mg/dL — ABNORMAL HIGH (ref 70–99)
Potassium: 4.5 mmol/L (ref 3.5–5.2)
Sodium: 143 mmol/L (ref 134–144)
Total Protein: 7 g/dL (ref 6.0–8.5)
eGFR: 101 mL/min/{1.73_m2} (ref >59)

## 2023-07-17 LAB — HEMOGLOBIN A1C
Est. average glucose Bld gHb Est-mCnc: 105 mg/dL
Hgb A1c MFr Bld: 5.3 % (ref 4.8–5.6)

## 2023-07-17 LAB — BETA HCG QUANT (REF LAB): hCG Quant: <1 m[IU]/mL

## 2023-07-17 LAB — RPR+HBSAG+HCVAB+...
HIV Screen 4th Generation wRfx: NONREACTIVE
Hep C Virus Ab: NONREACTIVE
Hepatitis B Surface Ag: NEGATIVE
RPR Ser Ql: NONREACTIVE

## 2023-07-22 ENCOUNTER — Encounter: Payer: Self-pay | Admitting: Obstetrics and Gynecology

## 2023-07-23 ENCOUNTER — Encounter: Payer: Self-pay | Admitting: Obstetrics and Gynecology

## 2023-07-23 DIAGNOSIS — R87610 Atypical squamous cells of undetermined significance on cytologic smear of cervix (ASC-US): Secondary | ICD-10-CM | POA: Insufficient documentation

## 2023-07-23 LAB — CYTOLOGY - PAP
Chlamydia: NEGATIVE
Comment: NEGATIVE
Comment: NEGATIVE
Comment: NEGATIVE
Comment: NEGATIVE
Comment: NEGATIVE
Comment: NORMAL
Diagnosis: UNDETERMINED — AB
HPV 16: NEGATIVE
HPV 18 / 45: NEGATIVE
High risk HPV: POSITIVE — AB
Neisseria Gonorrhea: NEGATIVE
Trichomonas: NEGATIVE

## 2023-07-31 ENCOUNTER — Telehealth: Payer: Self-pay | Admitting: Family Medicine

## 2023-07-31 NOTE — Telephone Encounter (Signed)
 Patient was speaking with a nurse earlier today and the call was disconnected. Patient is calling back to speak with nurse but is unsure of who she was speaking to.

## 2023-08-01 NOTE — Telephone Encounter (Signed)
 Left VM that if she continues to have questions or concerns to please give the office a call back.   Merlin Ege,RN

## 2023-08-05 ENCOUNTER — Telehealth: Payer: Self-pay | Admitting: Family Medicine

## 2023-08-05 NOTE — Telephone Encounter (Signed)
 Patient was messaging Dr.Pickens about doing a trial of Clomid, Provera and metformin. She has not heard back and is wondering when she will be able to pick up these medications.

## 2023-09-08 ENCOUNTER — Other Ambulatory Visit: Payer: Self-pay | Admitting: Obstetrics and Gynecology

## 2023-09-08 MED ORDER — CLOMIPHENE CITRATE 50 MG PO TABS: ORAL_TABLET | ORAL | 0 refills | Status: AC

## 2024-01-31 ENCOUNTER — Emergency Department (HOSPITAL_COMMUNITY)
Admission: EM | Admit: 2024-01-31 | Discharge: 2024-01-31 | Disposition: A | Discharge status: Home / Self Care | Payer: MEDICAID | Attending: Emergency Medicine | Admitting: Emergency Medicine

## 2024-01-31 ENCOUNTER — Emergency Department (HOSPITAL_COMMUNITY): Payer: MEDICAID

## 2024-01-31 DIAGNOSIS — W1809XA Striking against other object with subsequent fall, initial encounter: Secondary | ICD-10-CM | POA: Diagnosis not present

## 2024-01-31 DIAGNOSIS — R519 Headache, unspecified: Secondary | ICD-10-CM | POA: Diagnosis present

## 2024-01-31 DIAGNOSIS — S0033XA Contusion of nose, initial encounter: Secondary | ICD-10-CM

## 2024-01-31 DIAGNOSIS — S0121XA Laceration without foreign body of nose, initial encounter: Secondary | ICD-10-CM | POA: Diagnosis not present

## 2024-01-31 DIAGNOSIS — S060X0A Concussion without loss of consciousness, initial encounter: Secondary | ICD-10-CM | POA: Insufficient documentation

## 2024-01-31 NOTE — ED Triage Notes (Signed)
 Was getting dressed yesterday, bent over and fell, hitting bridge of nose onto the edge of their [employer] rx. No LOC at the time. Currently has skin glue on the lac. Some light sensitivity. Hx of vasovagal episodes.

## 2024-01-31 NOTE — Discharge Instructions (Signed)
 So take 600 mg of ibuprofen  every 6 hours as needed for pain and headache.  As we discussed, all your imaging was normal.  This is likely just a concussion.  I have given a handout on concussion.  You can read this 2 or more about it.  The main thing is that you need to adjust your activities of daily living to your symptoms.  I can give you some time off work.  I would like for you to follow-up with your primary care doctor.  You may return to the emergency department for any worsening symptoms.

## 2024-01-31 NOTE — ED Provider Notes (Signed)
 [place] EMERGENCY DEPARTMENT AT [facility] Provider Note   CSN: [id number] Arrival date & time: 01/31/24  1205     Patient presents with: Elizabeth Lam is a 23 y.o. female patient who presents to the emergency department today for further evaluation of headache, photophobia, and facial pain after she had a near syncopal episode and fell forward striking her face and bridge of the nose against the counter in the kitchen.  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  She denies any focal weakness or numbness.  She states that headache is global.  She does have some pain in the left eye as well.    Fall       Prior to Admission medications   Medication Sig Start Date End Date Taking? Authorizing Provider  clomiPHENE  (CLOMID ) 50 MG tablet 50mg  po qday for 5 days starting on cycle day 5. Repeat PRN next month. Increase to 100mg  po qday for 5 days starting on cycle day 5 from months 3-6 09/08/23   Izell Harari, MD  Multiple Vitamins-Minerals (MULTIVITAMIN) tablet Take 1 tablet by mouth daily. 07/16/23   Izell Harari, MD  ipratropium (ATROVENT ) 0.06 % nasal spray Place 2 sprays into both nostrils 4 (four) times daily. 02/21/19 06/02/19  Babara, Amy V, PA-C  metoCLOPramide  (REGLAN ) 10 MG tablet Take 1 tablet (10 mg total) by mouth every 6 (six) hours. 02/21/19 06/02/19  Babara Greig GAILS, PA-C  prazosin  (MINIPRESS ) 2 MG capsule Take 1 capsule (2 mg total) by mouth at bedtime. 11/11/18 06/02/19  Liguori, Macrina B, MD  SRONYX 0.1-20 MG-MCG tablet TAKE ONE TABLET BY MOUTH DAILY FOR 28 DAYS. 12/28/18 06/02/19  [provider]    Allergies: Bee venom, Fire ant, Other, Red dye #40 (allura red), Wasp venom, and Eucrisa [crisaborole]    Review of Systems  All other systems reviewed and are negative.   Updated Vital Signs BP 120/77   Pulse 90   Temp 98.9 F (37.2 C) (Oral)   Resp 18   SpO2 98%   Physical Exam Vitals and nursing  note reviewed.  Constitutional:      Appearance: Normal appearance.  HENT:     Head: Normocephalic.   Eyes:     General:        Right eye: No discharge.        Left eye: No discharge.     Conjunctiva/sclera: Conjunctivae normal.     Comments: Extraocular movements are intact without any obvious signs of entrapment.  Pulmonary:     Effort: Pulmonary effort is normal.  Skin:    General: Skin is warm and dry.     Findings: No rash.  Neurological:     General: No focal deficit present.     Mental Status: She is alert.     Comments: Cranial nerves II through XII are intact.  5/5 strength to the upper and lower extremities.  Normal sensation to the upper and lower extremities.  Psychiatric:        Mood and Affect: Mood normal.        Behavior: Behavior normal.     (all labs ordered are listed, but only abnormal results are displayed) Labs Reviewed - No data to display  EKG: None  Radiology: CT Maxillofacial Wo Contrast Result Date: 01/31/2024 CLINICAL DATA:  Status post trauma. EXAM: CT MAXILLOFACIAL WITHOUT CONTRAST TECHNIQUE: Multidetector CT imaging of the maxillofacial structures was performed. Multiplanar CT image reconstructions were  also generated. RADIATION DOSE REDUCTION: This exam was performed according to the departmental dose-optimization program which includes automated exposure control, adjustment of the mA and/or kV according to patient size and/or use of iterative reconstruction technique. COMPARISON:  None Available. FINDINGS: Osseous: No fracture or mandibular dislocation. No destructive process. Orbits: Negative. No traumatic or inflammatory finding. Sinuses: There is moderate severity left maxillary sinus mucosal thickening. Soft tissues: Mild bilateral paranasal soft tissue swelling is seen. Limited intracranial: No significant or unexpected finding. IMPRESSION: 1. Mild bilateral paranasal soft tissue swelling without evidence of an acute osseous abnormality. 2.  Moderate severity left maxillary sinus disease. Electronically Signed   By: Suzen Dials M.D.   On: 01/31/2024 14:16   CT Head Wo Contrast Result Date: 01/31/2024 CLINICAL DATA:  Status post trauma. EXAM: CT HEAD WITHOUT CONTRAST TECHNIQUE: Contiguous axial images were obtained from the base of the skull through the vertex without intravenous contrast. RADIATION DOSE REDUCTION: This exam was performed according to the departmental dose-optimization program which includes automated exposure control, adjustment of the mA and/or kV according to patient size and/or use of iterative reconstruction technique. COMPARISON:  None Available. FINDINGS: Brain: No evidence of acute infarction, hemorrhage, hydrocephalus, extra-axial collection or mass lesion/mass effect. Vascular: No hyperdense vessel or unexpected calcification. Skull: Normal. Negative for fracture or focal lesion. Sinuses/Orbits: There is mild to moderate severity left maxillary sinus mucosal thickening. Other: None. IMPRESSION: 1. No acute intracranial abnormality. 2. Mild to moderate severity left maxillary sinus disease. Electronically Signed   By: Suzen Dials M.D.   On: 01/31/2024 14:14     Procedures   Medications Ordered in the ED - No data to display  Clinical Course as of 01/31/24 1426  Sat Jan 31, 2024  1406 CT Head Wo Contrast [[place]]  1422 CT Head Wo Contrast No evidence of intracranial hemorrhage. [CF]  1423 CT Maxillofacial Wo Contrast No evidence of nasal bone fracture.  I do agree with radiologist interpretation. [CF]    Clinical Course User Index [CF] Theotis Cameron HERO, PA-C [[place]] Marilyne Inocente Seashore    Medical Decision Making Elizabeth Lam is a 23 y.o. female patient who presents to the emergency department today for further evaluation of head injury and a facial pain.  This is likely concussion.  Signs symptoms consistent with this.  No evidence of ocular entrapment.  Extraocular wounds are intact and  minimally painful.  Low suspicion for any infectious causes like preseptal or orbital cellulitis.  She decision-making was done with the patient whether or not to pursue imaging.  Patient requests imaging be done which I think is appropriate.  Will get CT head and maxillofacial.  CT imaging did reveal some maxillary disease on the left but no evidence of orbital trauma or intracranial hemorrhage.  Will treat concussion with over-the-counter NSAIDs.  Instructions given to the patient.  Education given.  Strict turn precaution were discussed.  She is safer discharge.   Amount and/or Complexity of Data Reviewed Radiology: ordered. Decision-making details documented in ED Course.     Final diagnoses:  Concussion without loss of consciousness, initial encounter  Contusion of nose, initial encounter    ED Discharge Orders     None          Theotis Cameron HERO, [place] 01/31/24 1426    Laurice Maude BROCKS, MD 02/01/24 [phone]
